# Patient Record
Sex: Male | Born: 1955 | Race: White | Hispanic: No | Marital: Single | Smoking: Current every day smoker
Health system: Southern US, Community
[De-identification: ages and names within clinical notes are randomized; demographics above are authoritative.]

## PROBLEM LIST (undated history)

## (undated) DIAGNOSIS — T7840XA Allergy, unspecified, initial encounter: Secondary | ICD-10-CM

## (undated) DIAGNOSIS — I2510 Atherosclerotic heart disease of native coronary artery without angina pectoris: Secondary | ICD-10-CM

## (undated) DIAGNOSIS — I10 Essential (primary) hypertension: Secondary | ICD-10-CM

## (undated) HISTORY — PX: HERNIA REPAIR: SHX51

## (undated) HISTORY — PX: LEG SURGERY: SHX1003

---

## 2005-10-11 ENCOUNTER — Ambulatory Visit: Payer: Self-pay | Admitting: Cardiology

## 2006-03-20 ENCOUNTER — Ambulatory Visit: Payer: Self-pay | Admitting: Cardiology

## 2010-09-25 ENCOUNTER — Emergency Department (HOSPITAL_COMMUNITY): Payer: Self-pay

## 2010-09-25 ENCOUNTER — Emergency Department (HOSPITAL_COMMUNITY)
Admission: EM | Admit: 2010-09-25 | Discharge: 2010-09-26 | Disposition: A | Discharge status: Home / Self Care | Payer: Self-pay | Attending: Emergency Medicine | Admitting: Emergency Medicine

## 2010-09-25 ENCOUNTER — Encounter: Payer: Self-pay | Admitting: *Deleted

## 2010-09-25 ENCOUNTER — Other Ambulatory Visit: Payer: Self-pay

## 2010-09-25 DIAGNOSIS — R079 Chest pain, unspecified: Secondary | ICD-10-CM | POA: Insufficient documentation

## 2010-09-25 DIAGNOSIS — R55 Syncope and collapse: Secondary | ICD-10-CM | POA: Insufficient documentation

## 2010-09-25 DIAGNOSIS — J189 Pneumonia, unspecified organism: Secondary | ICD-10-CM | POA: Insufficient documentation

## 2010-09-25 DIAGNOSIS — I2510 Atherosclerotic heart disease of native coronary artery without angina pectoris: Secondary | ICD-10-CM | POA: Insufficient documentation

## 2010-09-25 DIAGNOSIS — R059 Cough, unspecified: Secondary | ICD-10-CM | POA: Insufficient documentation

## 2010-09-25 DIAGNOSIS — I10 Essential (primary) hypertension: Secondary | ICD-10-CM | POA: Insufficient documentation

## 2010-09-25 DIAGNOSIS — R05 Cough: Secondary | ICD-10-CM | POA: Insufficient documentation

## 2010-09-25 DIAGNOSIS — Z79899 Other long term (current) drug therapy: Secondary | ICD-10-CM | POA: Insufficient documentation

## 2010-09-25 DIAGNOSIS — F17200 Nicotine dependence, unspecified, uncomplicated: Secondary | ICD-10-CM | POA: Insufficient documentation

## 2010-09-25 HISTORY — DX: Atherosclerotic heart disease of native coronary artery without angina pectoris: I25.10

## 2010-09-25 HISTORY — DX: Essential (primary) hypertension: I10

## 2010-09-25 NOTE — ED Notes (Signed)
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

## 2010-09-26 LAB — CBC
HCT: 35.1 % — ABNORMAL LOW (ref 39.0–52.0)
Hemoglobin: 12.1 g/dL — ABNORMAL LOW (ref 13.0–17.0)
WBC: 7.2 10*3/uL (ref 4.0–10.5)

## 2010-09-26 LAB — BASIC METABOLIC PANEL
BUN: 5 mg/dL — ABNORMAL LOW (ref 6–23)
CO2: 26 mEq/L (ref 19–32)
Chloride: 97 mEq/L (ref 96–112)
Glucose, Bld: 92 mg/dL (ref 70–99)
Potassium: 3.6 mEq/L (ref 3.5–5.1)

## 2010-09-26 LAB — CARDIAC PANEL(CRET KIN+CKTOT+MB+TROPI)
CK, MB: 2 ng/mL (ref 0.3–4.0)
Relative Index: INVALID (ref 0.0–2.5)
Troponin I: <0.3 ng/mL (ref <0.30)
Troponin I: <0.3 ng/mL (ref <0.30)

## 2010-09-26 MED ORDER — DEXTROSE 5 % IV SOLN
INTRAVENOUS | Status: AC
  Administered 2010-09-26: 02:00:00
  Filled 2010-09-26: qty 1

## 2010-09-26 MED ORDER — AZITHROMYCIN 250 MG PO TABS: 250.0000 mg | ORAL_TABLET | Freq: Every day | ORAL | Status: AC

## 2010-09-26 MED ORDER — DEXTROSE 5 % IV SOLN: 1.0000 g | Freq: Once | INTRAVENOUS | Status: DC

## 2010-09-26 MED ORDER — AZITHROMYCIN 250 MG PO TABS
500.0000 mg | ORAL_TABLET | Freq: Once | ORAL | Status: AC
  Administered 2010-09-26: 500 mg via ORAL
  Filled 2010-09-26: qty 2

## 2010-09-26 NOTE — ED Provider Notes (Signed)
History     Chief Complaint  Patient presents with  . Chest Pain   Patient is a 55 y.o. male presenting with chest pain. The history is provided by the patient.  Chest Pain Episode onset: today. Episode Length: pt is unable to tell me duration. Chest pain occurs intermittently. The chest pain is resolved. The severity of the pain is mild. The quality of the pain is described as aching. The pain does not radiate. Primary symptoms include shortness of breath and cough.    Pt admits to heavy ETOH use today.  He reports he felt dehydrated, he became dizzy and had brief syncopal episode, but denies injury.  He also reports some CP earlier but was brief and has resolved.    Past Medical History  Diagnosis Date  . Hypertension   . Coronary artery disease     History reviewed. No pertinent past surgical history.  No family history on file.  History  Substance Use Topics  . Smoking status: Current Everyday Smoker    Types: Cigarettes  . Smokeless tobacco: Not on file  . Alcohol Use: Yes      Review of Systems  Respiratory: Positive for cough and shortness of breath.   Cardiovascular: Positive for chest pain.  All other systems reviewed and are negative.    Physical Exam  BP 123/82  Pulse 79  Temp(Src) 97.6 F (36.4 C) (Oral)  Resp 77  SpO2 96%  Physical Exam  CONSTITUTIONAL: Well developed/well nourished HEAD AND FACE: Normocephalic/atraumatic, no signs of trauma EYES: EOMI/PERRL ENMT: Mucous membranes dry NECK: supple no meningeal signs SPINE:cspine nontender CV: S1/S2 noted, no murmurs/rubs/gallops noted LUNGS: Lungs are clear to auscultation bilaterally, no apparent distress ABDOMEN: soft, nontender, no rebound or guarding  NEURO: Pt is awake/alert, moves all extremitiesx4.  He was sleeping on my arrival but easily arousable EXTREMITIES: pulses normal, full ROM, no edema SKIN: warm, color normal PSYCH: no abnormalities of mood noted  ED Course   Procedures  MDM Nursing notes reviewed and considered in documentation Previous records reviewed and considered xrays reviewed and considered  ED ECG REPORT   Date: 09/26/2010   Rate: 78  Rhythm: normal sinus rhythm  QRS Axis: normal  Intervals: normal  ST/T Wave abnormalities: normal  Conduction Disutrbances:none  Narrative Interpretation:   Old EKG Reviewed: none available   Pt is in no distress, sleeping, admits to heavy ETOH use and he reports CP was only minimal.  He has had previous h/o syncope per records and it was felt to be ETOH related at that time   Records from 2008 showed he has had workup for syncope previously.  Usually with etoh intox per those records, he had abnormal cardiolite but negative markers.  There was no indication for cath at that time  Pt resting comfortably, vitals stable 2:04 AM BP 104/60  Pulse 75  Temp(Src) 97.6 F (36.4 C) (Oral)  Resp 28  SpO2 97% RR 16 on my exam  Pt still sleeping Family arrived, reports he had been drinking etoh and took one xanax.  He had brief syncopal event and then woke up.  He did report some chest tightness to family.  He was otherwise at his baseline  Pt never had resp rate above 20 on my exam No hypoxia Ambulatory without difficulty Will start abx for pneumonia, advised close followup for repeat CXR to ensure pneumonia resolved Pt agreeable Suspicion for significant dysrhythmia/PE is low given history.  Suspect this could have been med/etoh  related.  Joya Gaskins, MD 09/26/10 [phone]

## 2010-10-22 ENCOUNTER — Emergency Department (HOSPITAL_COMMUNITY): Payer: Self-pay

## 2010-10-22 ENCOUNTER — Emergency Department (HOSPITAL_COMMUNITY)
Admission: EM | Admit: 2010-10-22 | Discharge: 2010-10-23 | Disposition: A | Discharge status: Home / Self Care | Payer: Self-pay | Attending: Emergency Medicine | Admitting: Emergency Medicine

## 2010-10-22 ENCOUNTER — Encounter (HOSPITAL_COMMUNITY): Payer: Self-pay | Admitting: *Deleted

## 2010-10-22 DIAGNOSIS — R55 Syncope and collapse: Secondary | ICD-10-CM | POA: Insufficient documentation

## 2010-10-22 DIAGNOSIS — F17200 Nicotine dependence, unspecified, uncomplicated: Secondary | ICD-10-CM | POA: Insufficient documentation

## 2010-10-22 DIAGNOSIS — I2510 Atherosclerotic heart disease of native coronary artery without angina pectoris: Secondary | ICD-10-CM | POA: Insufficient documentation

## 2010-10-22 DIAGNOSIS — R079 Chest pain, unspecified: Secondary | ICD-10-CM

## 2010-10-22 DIAGNOSIS — F10929 Alcohol use, unspecified with intoxication, unspecified: Secondary | ICD-10-CM

## 2010-10-22 DIAGNOSIS — R072 Precordial pain: Secondary | ICD-10-CM | POA: Insufficient documentation

## 2010-10-22 DIAGNOSIS — I10 Essential (primary) hypertension: Secondary | ICD-10-CM | POA: Insufficient documentation

## 2010-10-22 DIAGNOSIS — F1010 Alcohol abuse, uncomplicated: Secondary | ICD-10-CM | POA: Insufficient documentation

## 2010-10-22 LAB — BASIC METABOLIC PANEL
Calcium: 8.9 mg/dL (ref 8.4–10.5)
Creatinine, Ser: 0.54 mg/dL (ref 0.50–1.35)
GFR calc non Af Amer: >60 mL/min (ref >60)
Glucose, Bld: 92 mg/dL (ref 70–99)
Sodium: 136 mEq/L (ref 135–145)

## 2010-10-22 LAB — CBC
MCH: 33.8 pg (ref 26.0–34.0)
MCHC: 34.4 g/dL (ref 30.0–36.0)
Platelets: 177 10*3/uL (ref 150–400)

## 2010-10-22 LAB — CARDIAC PANEL(CRET KIN+CKTOT+MB+TROPI)
CK, MB: 2.4 ng/mL (ref 0.3–4.0)
Total CK: 63 U/L (ref 7–232)

## 2010-10-22 NOTE — ED Provider Notes (Signed)
History     CSN: [id number] Arrival date & time: 10/22/2010  8:40 PM  Chief Complaint  Patient presents with  . Chest Pain   HPI Comments: Pt states he has been moving furniture and boxes all day today.   He was sitting at his kitchen table ~ 1630 and started having L sternal border "sharp" CP.  " i stood up and the next thing i knew i was laying on the floor".  Duration of syncope unkown.  Reportedly witnessed by his daughter but she is not here to speak with me.  Pt reports having #3 40 oz. Beers and then several 12 oz beers today.  Patient is a 55 y.o. male presenting with chest pain. The history is provided by the patient. No language interpreter was used.  Chest Pain The chest pain began 6 - 12 hours ago. The pain is associated with lifting and exertion. At its most intense, the pain is at 7/10. The pain is currently at 0/10. The quality of the pain is described as sharp. The pain does not radiate. Exacerbated by: movement. Primary symptoms include syncope. Pertinent negatives for primary symptoms include no shortness of breath, no palpitations, no nausea and no vomiting.  There was no nausea. The syncopal episode did not occur with palpitations or shortness of breath.     Past Medical History  Diagnosis Date  . Hypertension   . Coronary artery disease     History reviewed. No pertinent past surgical history.  History reviewed. No pertinent family history.  History  Substance Use Topics  . Smoking status: Current Everyday Smoker    Types: Cigarettes  . Smokeless tobacco: Not on file  . Alcohol Use: Yes     pt reports 2 40oz drinks today      Review of Systems  Respiratory: Negative for shortness of breath.        "sharp" CP at LSB.   No radiation.    Cardiovascular: Positive for chest pain and syncope. Negative for palpitations and leg swelling.  Gastrointestinal: Negative for nausea and vomiting.  Skin: Negative for pallor.  All other systems reviewed and are  negative.    Physical Exam  BP 96/67  Pulse 66  Temp(Src) 97.4 F (36.3 C) (Oral)  Resp 18  Ht 5\' 10"  (1.778 m)  Wt 140 lb (63.504 kg)  BMI 20.09 kg/m2  SpO2 95%  Physical Exam  Nursing note and vitals reviewed. Constitutional: He is oriented to person, place, and time. Vital signs are normal. He appears well-developed and well-nourished. No distress.  HENT:  Head: Normocephalic and atraumatic.  Right Ear: External ear normal.  Left Ear: External ear normal.  Nose: Nose normal.  Mouth/Throat: No oropharyngeal exudate.  Eyes: Conjunctivae and EOM are normal. Pupils are equal, round, and reactive to light. Right eye exhibits no discharge. Left eye exhibits no discharge. No scleral icterus.  Neck: Normal range of motion. Neck supple. No JVD present. No tracheal deviation present. No thyromegaly present.  Cardiovascular: Normal rate, regular rhythm, normal heart sounds, intact distal pulses and normal pulses.  Exam reveals no gallop and no friction rub.   No murmur heard. Pulmonary/Chest: Effort normal and breath sounds normal. No stridor. No respiratory distress. He has no wheezes. He has no rales. He exhibits no tenderness.  Abdominal: Soft. Normal appearance and bowel sounds are normal. He exhibits no distension and no mass. There is no tenderness. There is no rebound and no guarding.  Musculoskeletal: Normal range of motion.  He exhibits no edema and no tenderness.  Lymphadenopathy:    He has no cervical adenopathy.  Neurological: He is alert and oriented to person, place, and time. He has normal reflexes. No cranial nerve deficit. Coordination normal. GCS eye subscore is 4. GCS verbal subscore is 5. GCS motor subscore is 6.  Reflex Scores:      Tricep reflexes are 2+ on the right side and 2+ on the left side.      Bicep reflexes are 2+ on the right side and 2+ on the left side.      Brachioradialis reflexes are 2+ on the right side and 2+ on the left side.      Patellar reflexes  are 2+ on the right side and 2+ on the left side.      Achilles reflexes are 2+ on the right side and 2+ on the left side. Skin: Skin is warm and dry. No rash noted. He is not diaphoretic.  Psychiatric: He has a normal mood and affect. His speech is normal and behavior is normal. Judgment and thought content normal. Cognition and memory are normal.    ED Course  Procedures  MDM       Worthy Rancher, PA 10/22/10 2342  Worthy Rancher, PA 12/22/10 1409

## 2010-10-22 NOTE — ED Notes (Addendum)
Pt reports he was moving and lifting boxes when he began experiencing chest pain.  Per EMS, pt has drunk 2 40's and taken 3 Xanax today.  Verified by pt.  Pt presently denies any pain.  At this time, pt somewhat drowsy and speech slurred.  Pt placed on monitor.  No SOB or distress noted.

## 2010-10-22 NOTE — ED Notes (Signed)
Pt reports he was moving, and became dizzy and "everything went black."  States chest pain started sometime this evening, unsure what time.

## 2010-10-22 NOTE — ED Notes (Signed)
Received call from pt's daughter, Shaft Corigliano, who stated she will be driving pt home if he is to be discharged.  Number provided [phone].

## 2010-10-22 NOTE — ED Notes (Addendum)
Pt resting quietly at this time. No distress noted.

## 2010-10-23 ENCOUNTER — Other Ambulatory Visit: Payer: Self-pay

## 2010-10-23 NOTE — ED Notes (Signed)
Attempted to call pt's daughter, as requested, to come pick pt up post discharge.  Left voice mail.

## 2010-10-23 NOTE — ED Notes (Signed)
Returned call to pt's daughter.  She states she is on her way to pick pt up at this time.

## 2010-11-13 ENCOUNTER — Emergency Department (HOSPITAL_COMMUNITY): Payer: Self-pay

## 2010-11-13 ENCOUNTER — Emergency Department (HOSPITAL_COMMUNITY)
Admission: EM | Admit: 2010-11-13 | Discharge: 2010-11-13 | Disposition: A | Discharge status: Home / Self Care | Payer: Self-pay | Attending: Emergency Medicine | Admitting: Emergency Medicine

## 2010-11-13 ENCOUNTER — Encounter (HOSPITAL_COMMUNITY): Payer: Self-pay | Admitting: Emergency Medicine

## 2010-11-13 DIAGNOSIS — N390 Urinary tract infection, site not specified: Secondary | ICD-10-CM | POA: Insufficient documentation

## 2010-11-13 DIAGNOSIS — I10 Essential (primary) hypertension: Secondary | ICD-10-CM | POA: Insufficient documentation

## 2010-11-13 DIAGNOSIS — Z7982 Long term (current) use of aspirin: Secondary | ICD-10-CM | POA: Insufficient documentation

## 2010-11-13 DIAGNOSIS — Z888 Allergy status to other drugs, medicaments and biological substances status: Secondary | ICD-10-CM | POA: Insufficient documentation

## 2010-11-13 DIAGNOSIS — I2510 Atherosclerotic heart disease of native coronary artery without angina pectoris: Secondary | ICD-10-CM | POA: Insufficient documentation

## 2010-11-13 DIAGNOSIS — F17200 Nicotine dependence, unspecified, uncomplicated: Secondary | ICD-10-CM | POA: Insufficient documentation

## 2010-11-13 LAB — CBC
MCH: 33.9 pg (ref 26.0–34.0)
MCV: 98.8 fL (ref 78.0–100.0)
Platelets: 178 10*3/uL (ref 150–400)
RDW: 14.8 % (ref 11.5–15.5)

## 2010-11-13 LAB — COMPREHENSIVE METABOLIC PANEL
ALT: 27 U/L (ref 0–53)
AST: 62 U/L — ABNORMAL HIGH (ref 0–37)
Albumin: 3.3 g/dL — ABNORMAL LOW (ref 3.5–5.2)
Calcium: 8.6 mg/dL (ref 8.4–10.5)
GFR calc Af Amer: >60 mL/min (ref >60)
Sodium: 135 mEq/L (ref 135–145)
Total Protein: 7 g/dL (ref 6.0–8.3)

## 2010-11-13 LAB — URINALYSIS, ROUTINE W REFLEX MICROSCOPIC
Bilirubin Urine: NEGATIVE
Nitrite: NEGATIVE
Protein, ur: NEGATIVE mg/dL
Urobilinogen, UA: 0.2 mg/dL (ref 0.0–1.0)

## 2010-11-13 LAB — DIFFERENTIAL
Basophils Absolute: 0.1 10*3/uL (ref 0.0–0.1)
Basophils Relative: 1 % (ref 0–1)
Eosinophils Absolute: 0.1 10*3/uL (ref 0.0–0.7)
Eosinophils Relative: 1 % (ref 0–5)

## 2010-11-13 LAB — URINE MICROSCOPIC-ADD ON

## 2010-11-13 MED ORDER — CIPROFLOXACIN HCL 500 MG PO TABS: 500.0000 mg | ORAL_TABLET | Freq: Two times a day (BID) | ORAL | Status: AC

## 2010-11-13 MED ORDER — ONDANSETRON HCL 4 MG PO TABS: 4.0000 mg | ORAL_TABLET | Freq: Four times a day (QID) | ORAL | Status: AC

## 2010-11-13 MED ORDER — DEXTROSE 5 % IV SOLN
1.0000 g | Freq: Once | INTRAVENOUS | Status: AC
  Administered 2010-11-13: 1 g via INTRAVENOUS
  Filled 2010-11-13: qty 1

## 2010-11-13 MED ORDER — KETOROLAC TROMETHAMINE 30 MG/ML IJ SOLN
30.0000 mg | Freq: Once | INTRAMUSCULAR | Status: AC
  Administered 2010-11-13: 30 mg via INTRAVENOUS
  Filled 2010-11-13: qty 1

## 2010-11-13 MED ORDER — SODIUM CHLORIDE 0.9 % IV SOLN
Freq: Once | INTRAVENOUS | Status: AC
  Administered 2010-11-13: 14:00:00 via INTRAVENOUS

## 2010-11-13 MED ORDER — IOHEXOL 300 MG/ML  SOLN
100.0000 mL | Freq: Once | INTRAMUSCULAR | Status: AC | PRN
  Administered 2010-11-13: 100 mL via INTRAVENOUS

## 2010-11-13 MED ORDER — HYDROCODONE-ACETAMINOPHEN 5-325 MG PO TABS: 1.0000 | ORAL_TABLET | ORAL | Status: AC | PRN

## 2010-11-13 MED ORDER — HYDROMORPHONE HCL 1 MG/ML IJ SOLN
1.0000 mg | Freq: Once | INTRAMUSCULAR | Status: AC
  Administered 2010-11-13: 1 mg via INTRAVENOUS
  Filled 2010-11-13: qty 1

## 2010-11-13 MED ORDER — PANTOPRAZOLE SODIUM 40 MG IV SOLR
40.0000 mg | Freq: Once | INTRAVENOUS | Status: AC
  Administered 2010-11-13: 40 mg via INTRAVENOUS
  Filled 2010-11-13: qty 40

## 2010-11-13 MED ORDER — ONDANSETRON HCL 4 MG/2ML IJ SOLN
4.0000 mg | Freq: Once | INTRAMUSCULAR | Status: AC
  Administered 2010-11-13: 4 mg via INTRAVENOUS
  Filled 2010-11-13: qty 2

## 2010-11-13 NOTE — ED Notes (Signed)
Pt c/o rt groin pain that is sharp in nature. Pain started this am when pt went to use bathroom and had difficulty voiding.pt states he has" urge to go and is hard to pee when urge hits me"  Pt states " there was blood in my urine at the end" pt states pain is intermittent. Pt states " I consumed a lot of alcohol over the weekend for my birthday" pt had a mixed drink this am. Pt recently had unprotected sex within the last month and recently told his sexual partner has Hepatitis C.

## 2010-11-13 NOTE — ED Notes (Signed)
Pt states he went to urinate this am and he states he had a difficult time going and when he finished he was bleeding.

## 2010-11-13 NOTE — ED Provider Notes (Signed)
Scribed for Justin Hutching, MD, the patient was seen in room APFT24/APFT24 . This chart was scribed by Ellie Lunch. This patient's care was started at 10:07 AM.   CSN: [id number] Arrival date & time: 11/13/2010 10:01 AM  Chief Complaint  Patient presents with  . Hematuria   HPI Duke Justin is a 55 y.o. male who presents to the Emergency Department complaining of hematuria. Pt reports at work this morning he went to the restroom and had a small amount of blood at the end of urinating. Pt reports no other episodes of blood in his urine. Pt also reports he has been able to taste blood since this morning. He denies coughing or spotting up blood. Pt reports no PCP. Pt has a history of HTN and is not taking any medications for treatment.  Past Medical History  Diagnosis Date  . Hypertension   . Coronary artery disease     Past Surgical History  Procedure Date  . Leg surgery   . Hernia repair    MEDICATIONS:  Previous Medications   ASPIRIN 81 MG TABLET    Take 81 mg by mouth daily.     NITROGLYCERIN (NITROSTAT) 0.4 MG SL TABLET    Place 0.4 mg under the tongue every 5 (five) minutes as needed.     UNKNOWN TO PATIENT    Take 1 tablet by mouth daily. BLOOD PRESSURE MEDICATION: NAME UNKNOWN     ALLERGIES:  Allergies as of 11/13/2010 - Review Complete 11/13/2010  Allergen Reaction Noted  . Bee venom  11/13/2010  . Novocain Nausea And Vomiting 09/26/2010     History reviewed. No pertinent family history.  History  Substance Use Topics  . Smoking status: Current Everyday Smoker -- 2.0 packs/day    Types: Cigarettes  . Smokeless tobacco: Not on file  . Alcohol Use: Yes     pt reports 2 40oz drinks today "heavy"    Review of Systems  Physical Exam  BP 155/95  Pulse 75  Temp(Src) 98.4 F (36.9 C) (Oral)  Resp 20  Ht 5\' 9"  (1.753 m)  Wt 134 lb (60.782 kg)  BMI 19.79 kg/m2  SpO2 100%  Physical Exam  Nursing note and vitals reviewed. Constitutional: He is oriented to  person, place, and time. He appears well-developed.       PT appears thin and older than stated age. Pt's face appears very wrinkled.    HENT:  Head: Normocephalic and atraumatic.  Eyes: Conjunctivae and EOM are normal. Pupils are equal, round, and reactive to light.  Neck: Neck supple.  Cardiovascular: Normal rate and regular rhythm.   Pulmonary/Chest: Effort normal and breath sounds normal.  Abdominal: There is tenderness.       Slight Tenderness RLQ.   Genitourinary: Rectum normal, prostate normal and penis normal.       Urine appears clear.   Musculoskeletal: Normal range of motion.  Neurological: He is alert and oriented to person, place, and time.  Skin: Skin is warm and dry.  Psychiatric: He has a normal mood and affect.   Procedures  OTHER DATA REVIEWED: Nursing notes, vital signs, and past medical records reviewed.   DIAGNOSTIC STUDIES: Oxygen Saturation is 100% on room air, normal by my interpretation.    LABS / RADIOLOGY:  Results for orders placed during the hospital encounter of 11/13/10  URINALYSIS, ROUTINE W REFLEX MICROSCOPIC      Component Value Range   Color, Urine YELLOW  YELLOW    Appearance HAZY (*)  CLEAR    Specific Gravity, Urine <1.005 (*) 1.005 - 1.030    pH 6.0  5.0 - 8.0    Glucose, UA NEGATIVE  NEGATIVE (mg/dL)   Hgb urine dipstick LARGE (*) NEGATIVE    Bilirubin Urine NEGATIVE  NEGATIVE    Ketones, ur NEGATIVE  NEGATIVE (mg/dL)   Protein, ur NEGATIVE  NEGATIVE (mg/dL)   Urobilinogen, UA 0.2  0.0 - 1.0 (mg/dL)   Nitrite NEGATIVE  NEGATIVE    Leukocytes, UA LARGE (*) NEGATIVE   CBC      Component Value Range   WBC 7.4  4.0 - 10.5 (K/uL)   RBC 4.13 (*) 4.22 - 5.81 (MIL/uL)   Hemoglobin 14.0  13.0 - 17.0 (g/dL)   HCT 16.1  09.6 - 04.5 (%)   MCV 98.8  78.0 - 100.0 (fL)   MCH 33.9  26.0 - 34.0 (pg)   MCHC 34.3  30.0 - 36.0 (g/dL)   RDW 40.9  81.1 - 91.4 (%)   Platelets 178  150 - 400 (K/uL)  DIFFERENTIAL      Component Value Range    Neutrophils Relative 69  43 - 77 (%)   Neutro Abs 5.1  1.7 - 7.7 (K/uL)   Lymphocytes Relative 22  12 - 46 (%)   Lymphs Abs 1.6  0.7 - 4.0 (K/uL)   Monocytes Relative 7  3 - 12 (%)   Monocytes Absolute 0.5  0.1 - 1.0 (K/uL)   Eosinophils Relative 1  0 - 5 (%)   Eosinophils Absolute 0.1  0.0 - 0.7 (K/uL)   Basophils Relative 1  0 - 1 (%)   Basophils Absolute 0.1  0.0 - 0.1 (K/uL)  COMPREHENSIVE METABOLIC PANEL      Component Value Range   Sodium 135  135 - 145 (mEq/L)   Potassium 3.6  3.5 - 5.1 (mEq/L)   Chloride 100  96 - 112 (mEq/L)   CO2 27  19 - 32 (mEq/L)   Glucose, Bld 91  70 - 99 (mg/dL)   BUN 4 (*) 6 - 23 (mg/dL)   Creatinine, Ser 7.82  0.50 - 1.35 (mg/dL)   Calcium 8.6  8.4 - 95.6 (mg/dL)   Total Protein 7.0  6.0 - 8.3 (g/dL)   Albumin 3.3 (*) 3.5 - 5.2 (g/dL)   AST 62 (*) 0 - 37 (U/L)   ALT 27  0 - 53 (U/L)   Alkaline Phosphatase 57  39 - 117 (U/L)   Total Bilirubin 0.4  0.3 - 1.2 (mg/dL)   GFR calc non Af Amer >60  >60 (mL/min)   GFR calc Af Amer >60  >60 (mL/min)  URINE MICROSCOPIC-ADD ON      Component Value Range   Squamous Epithelial / LPF RARE  RARE    WBC, UA 7-10  <3 (WBC/hpf)   RBC / HPF 3-6  <3 (RBC/hpf)   Bacteria, UA FEW (*) RARE    Dg Chest 2 View  11/13/2010  *RADIOLOGY REPORT*  Clinical Data: Hemoptysis.  CHEST - 2 VIEW  Comparison: 08/13 and 09/25/2010  Findings: The heart size and vascularity are normal and the lungs are clear.  No effusions.  Old trauma to the distal right clavicle  IMPRESSION: No acute abnormalities.  Original Report Authenticated By: Gwynn Burly, M.D.   Ct Abdomen Pelvis W Contrast  11/13/2010  *RADIOLOGY REPORT*  Clinical Data: Hematuria  CT ABDOMEN AND PELVIS WITH CONTRAST  Technique:  Multidetector CT imaging of the abdomen and pelvis was  performed following the standard protocol during bolus administration of intravenous contrast.  Contrast: 100 ml Omnipaque-300  Comparison: None.  Findings: Tiny hypodensities in the kidneys  are too small to characterize.  Kidneys are otherwise unremarkable.  Specifically, there is no evidence of hydronephrosis, renal mass, or nephrolithiasis.  Liver, gallbladder, spleen, pancreas are within normal limits. Prominence of the adrenal glands in a hyperplasia pattern.  Focal wedge-shaped enhancement in the medial segment of the left lobe of the liver on image 19 of series 2 is a vascular phenomenon.  Small gastrohepatic ligament nodes are present.  1.4 cm calcified soft tissue density in the left lower quadrant, lateral to the descending colon 45 has a benign appearance.  Grade 1 L5-S1 spondylolisthesis. L4-5 central disc protrusion with bilateral lateral recess narrowing.  The bladder is distended.  Mild thickening of the bladder wall is diffuse.  Diverticulosis of the sigmoid colon without definitive evidence of inflammatory disease.  Atherosclerotic changes of the aorta and iliac vasculature are noted.  Unremarkable prostate gland.  IMPRESSION: Kidneys are within normal limits.  Bladder is distended with bladder wall thickening.  Correlate with urinalysis as for the need for cystoscopy.  L5-S1 spondylolisthesis.  Original Report Authenticated By: Donavan Burnet, M.D.   Dg Chest Portable 1 View  10/23/2010  *RADIOLOGY REPORT*  Clinical Data: Chest pain.  Lethargy.  PORTABLE CHEST - 1 VIEW  Comparison: 09/25/2010  Findings: The patient is rotated to the right on today's exam, resulting in reduced diagnostic sensitivity and specificity.   The left rib deformities compatible with healed fractures noted.  The reticulonodular interstitial accentuation shown on the prior exam has resolved.  The lungs appear clear.  Cardiac and mediastinal contours appear unremarkable.  There is evidence of old displaced right clavicular fracture.  IMPRESSION:  1.  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 2.  No acute findings.  Original Report Authenticated By: Dellia Cloud, M.D.    ED  COURSE / COORDINATION OF CARE: 10:55  AM EDP discussed diagnostic possibilities with patient including kidney stones and ordered the following:  . CT Abdomen Pelvis W Contrast  . DG Chest 2 View  . Urinalysis with microscopic  . CBC  . Differential  . Comprehensive metabolic panel  . Urine microscopic-add on   MDM: Complains of hematuria CT abdomen and pelvis shows no kidney stones. 7-10 white cells in urine. Will culture urine start antibiotic IMPRESSION: Diagnoses that have been ruled out:  Diagnoses that are still under consideration:  Final diagnoses:    MEDICATIONS GIVEN IN THE E.D.  Medications  HYDROmorphone (DILAUDID) injection 1 mg (1 mg Intravenous Given 11/13/10 1217)  ketorolac (TORADOL) injection 30 mg (30 mg Intravenous Given 11/13/10 1217)  ondansetron (ZOFRAN) injection 4 mg (4 mg Intravenous Given 11/13/10 1217)   SCRIBE ATTESTATION: I personally performed the services described in this documentation, which was scribed in my presence. The recorded information has been reviewed and considered. No att. providers found        Justin Hutching, MD 11/20/10 1954

## 2010-11-13 NOTE — ED Provider Notes (Addendum)
Medical screening examination/treatment/procedure(s) were conducted as a shared visit with non-physician practitioner(s) and myself.  I personally evaluated the patient during the encounter   Benny Lennert, MD 11/13/10 1311  Benny Lennert, MD 11/20/10 1039

## 2010-11-14 LAB — URINE CULTURE: Culture  Setup Time: 201209041745

## 2010-12-26 NOTE — ED Provider Notes (Signed)
Medical screening examination/treatment/procedure(s) were performed by non-physician practitioner and as supervising physician I was immediately available for consultation/collaboration.  Laray Anger, DO 12/26/10 [phone]

## 2011-09-12 IMAGING — CT CT ABD-PELV W/ CM
2 of 4 series · 16 of 46 positions shown, 18 images · IV contrast (Omnipaque 300)
Comparison: None.

CLINICAL DATA: Hematuria

CT ABDOMEN AND PELVIS WITH CONTRAST
TECHNIQUE: Multidetector CT imaging of the abdomen and pelvis was
performed following the standard protocol during bolus
administration of intravenous contrast.
Contrast: 100 ml Xmnipaque-ESS

[Series 2: abd_pel_with 5.0 b40f · axial · 0.59mm/px · z∈[-408,-23]mm · 13 of 85 slices shown, 15 images]
[im 4/85  soft-tissue]
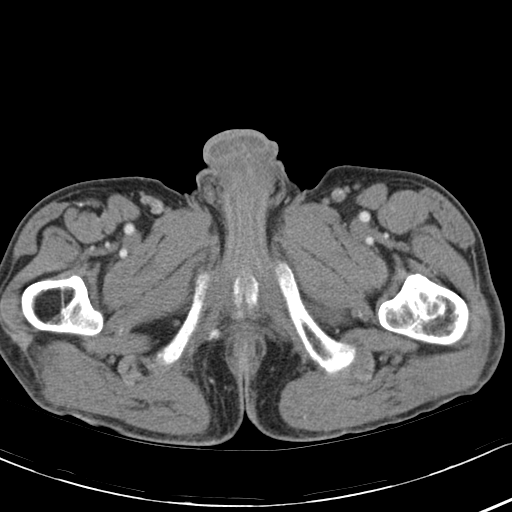
[im 4/85  bone]
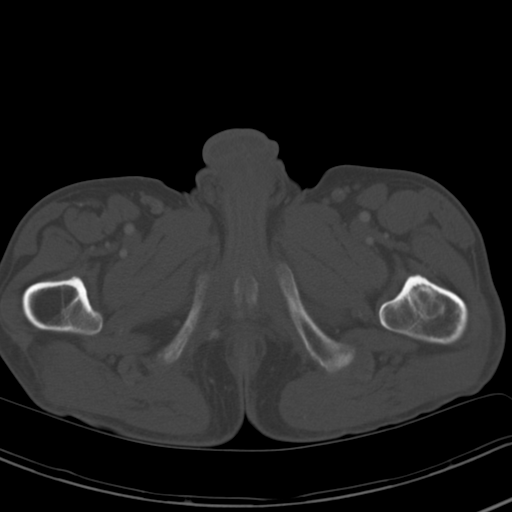
[im 12/85  soft-tissue]
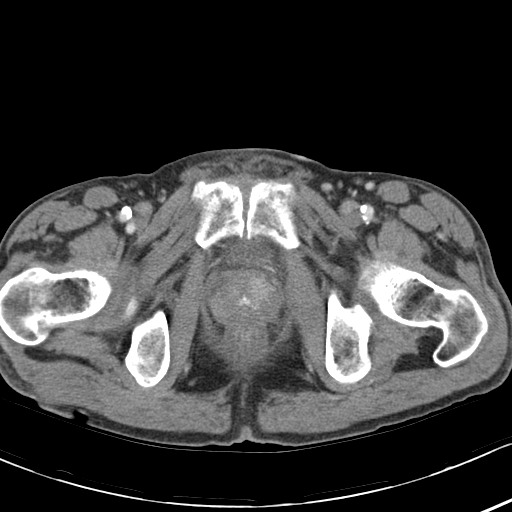
[im 20/85  soft-tissue]
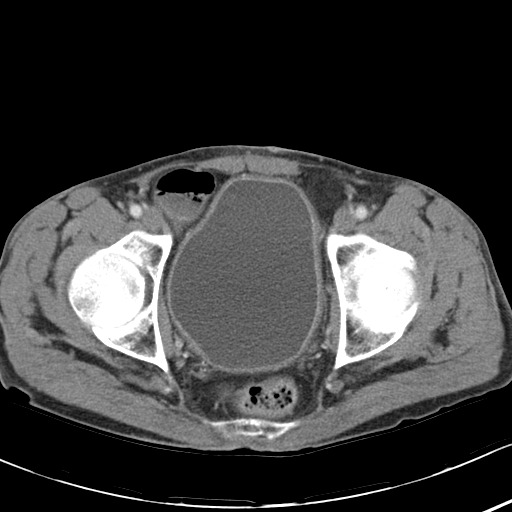
[im 23/85  soft-tissue]
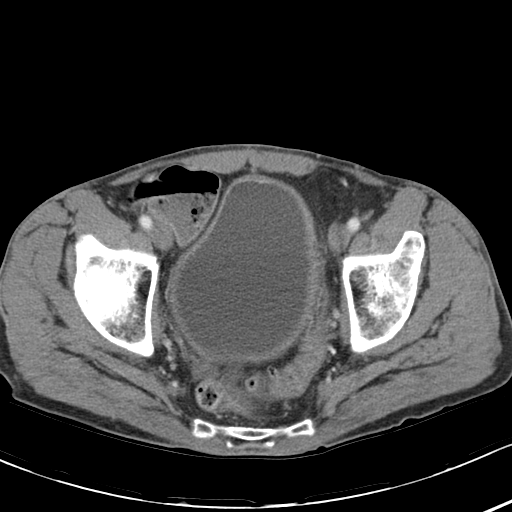
[im 31/85  soft-tissue]
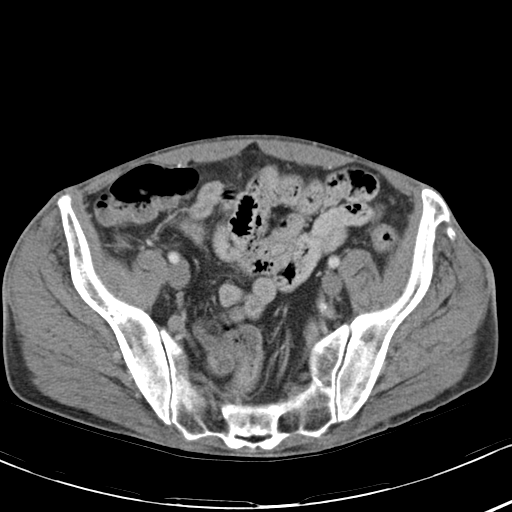
[im 35/85  soft-tissue]
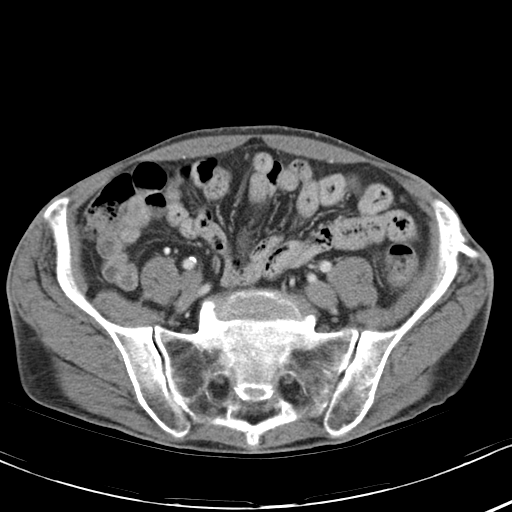
[im 43/85  soft-tissue]
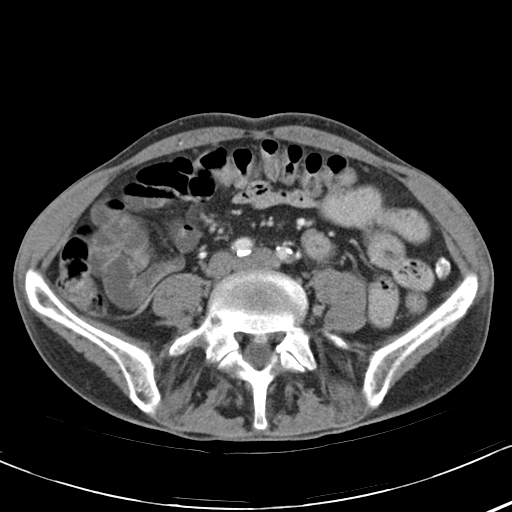
[im 50/85  soft-tissue]
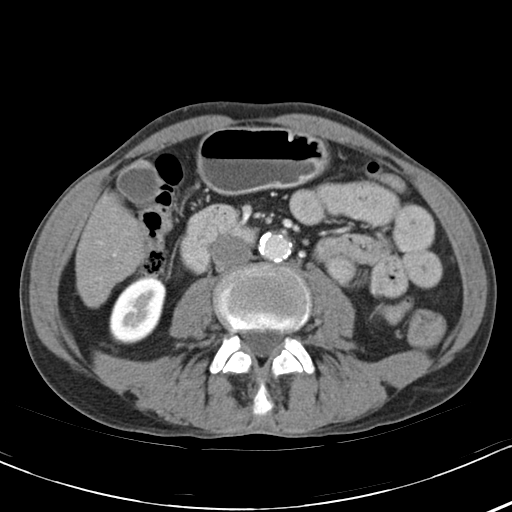
[im 54/85  soft-tissue]
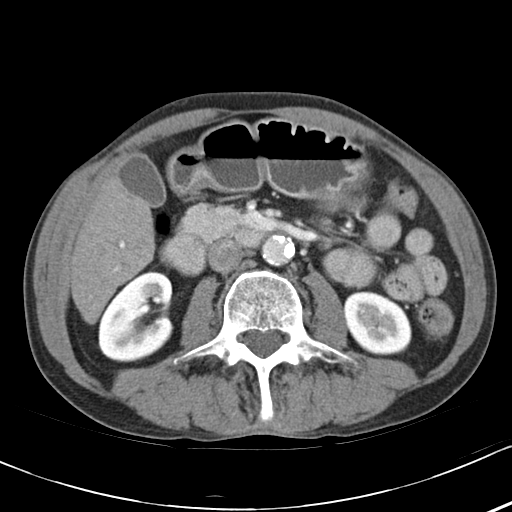
[im 54/85  bone]
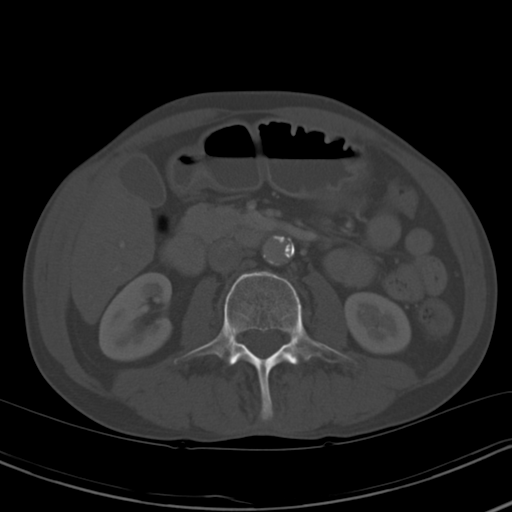
[im 62/85  soft-tissue]
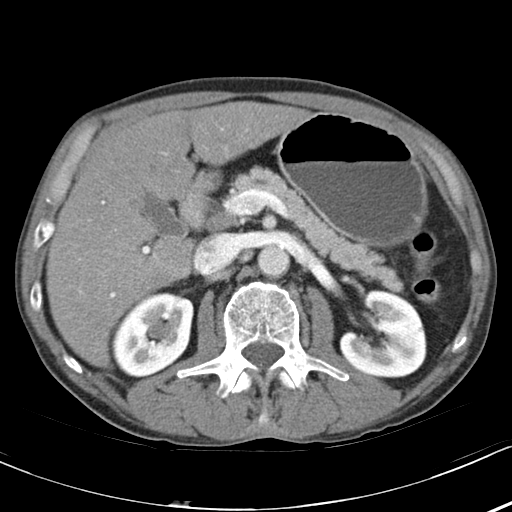
[im 65/85  soft-tissue]
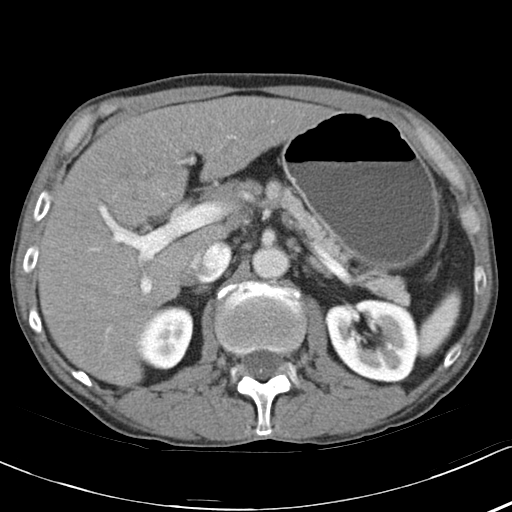
[im 73/85  soft-tissue]
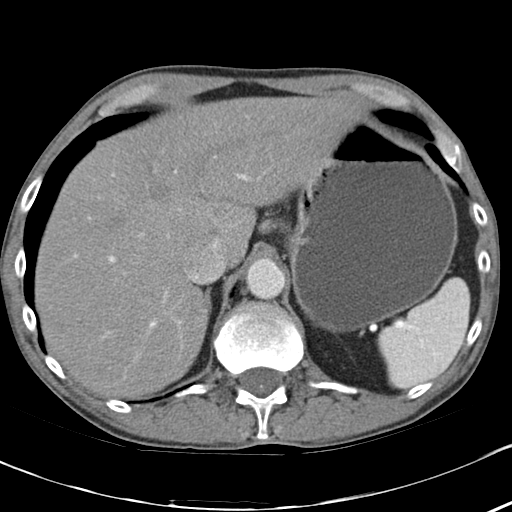
[im 81/85  soft-tissue]
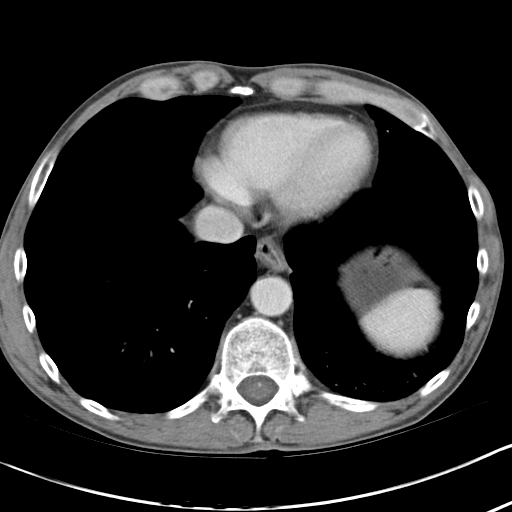

[Series 4: abd_pel_with 3.0 spo cor · coronal · 0.63mm/px · 3 of 69 slices shown]
[im 23/69  soft-tissue]
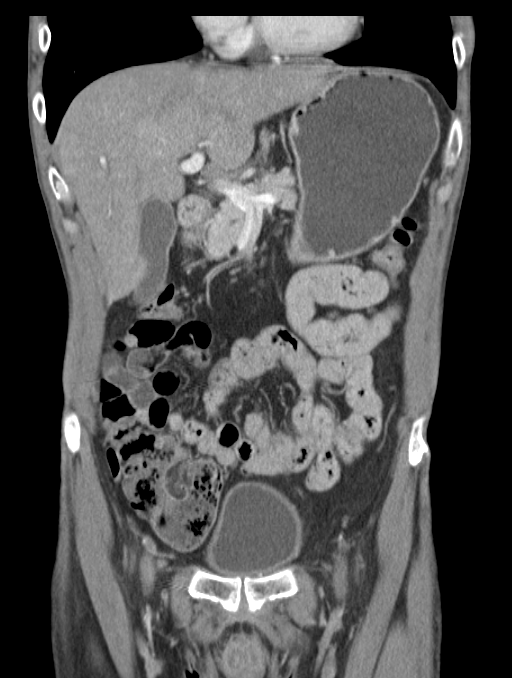
[im 31/69  soft-tissue]
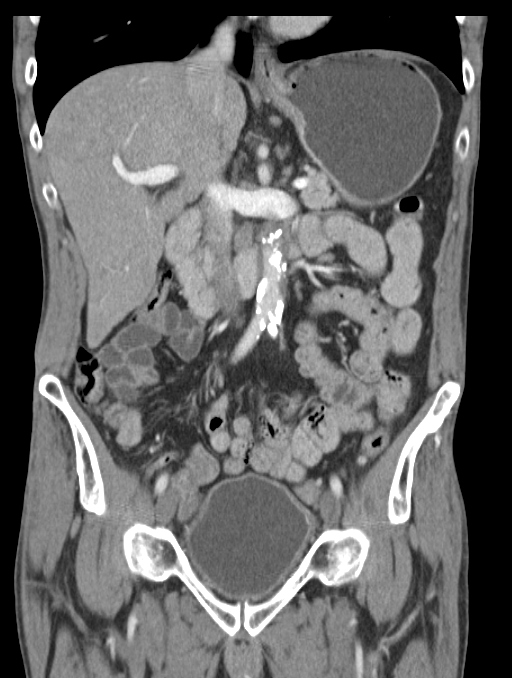
[im 38/69  soft-tissue]
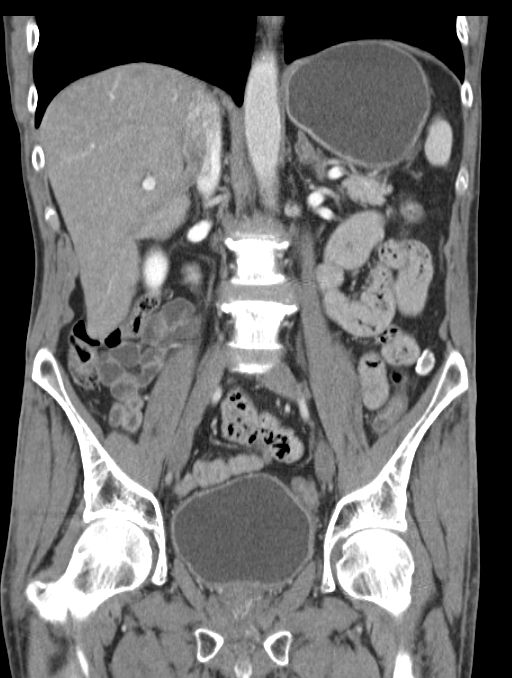

[16 of 46 positions shown; findings below may reference images not displayed]

FINDINGS: Tiny hypodensities in the kidneys are too small to
characterize.  Kidneys are otherwise unremarkable.  Specifically,
there is no evidence of hydronephrosis, renal mass, or
nephrolithiasis.

Liver, gallbladder, spleen, pancreas are within normal limits.
Prominence of the adrenal glands in a hyperplasia pattern.  Focal
wedge-shaped enhancement in the medial segment of the left lobe of
the liver on image 19 of series 2 is a vascular phenomenon.

Small gastrohepatic ligament nodes are present.

1.4 cm calcified soft tissue density in the left lower quadrant,
lateral to the descending colon 45 has a benign appearance.

Grade 1 L5-S1 spondylolisthesis. L4-5 central disc protrusion with
bilateral lateral recess narrowing.

The bladder is distended.  Mild thickening of the bladder wall is
diffuse.

Diverticulosis of the sigmoid colon without definitive evidence of
inflammatory disease.

Atherosclerotic changes of the aorta and iliac vasculature are
noted.  Unremarkable prostate gland.
IMPRESSION: Kidneys are within normal limits.

Bladder is distended with bladder wall thickening.  Correlate with
urinalysis as for the need for cystoscopy.

L5-S1 spondylolisthesis.

## 2012-11-06 ENCOUNTER — Emergency Department (HOSPITAL_COMMUNITY): Payer: Medicaid Other

## 2012-11-06 ENCOUNTER — Encounter (HOSPITAL_COMMUNITY): Payer: Self-pay | Admitting: *Deleted

## 2012-11-06 ENCOUNTER — Telehealth: Payer: Self-pay | Admitting: Internal Medicine

## 2012-11-06 ENCOUNTER — Emergency Department (HOSPITAL_COMMUNITY)
Admission: EM | Admit: 2012-11-06 | Discharge: 2012-11-06 | Disposition: A | Discharge status: Home / Self Care | Payer: Medicaid Other | Attending: Emergency Medicine | Admitting: Emergency Medicine

## 2012-11-06 DIAGNOSIS — Z79899 Other long term (current) drug therapy: Secondary | ICD-10-CM | POA: Insufficient documentation

## 2012-11-06 DIAGNOSIS — I10 Essential (primary) hypertension: Secondary | ICD-10-CM | POA: Insufficient documentation

## 2012-11-06 DIAGNOSIS — I2510 Atherosclerotic heart disease of native coronary artery without angina pectoris: Secondary | ICD-10-CM | POA: Insufficient documentation

## 2012-11-06 DIAGNOSIS — R059 Cough, unspecified: Secondary | ICD-10-CM | POA: Insufficient documentation

## 2012-11-06 DIAGNOSIS — C329 Malignant neoplasm of larynx, unspecified: Secondary | ICD-10-CM

## 2012-11-06 DIAGNOSIS — Z87898 Personal history of other specified conditions: Secondary | ICD-10-CM | POA: Insufficient documentation

## 2012-11-06 DIAGNOSIS — R1310 Dysphagia, unspecified: Secondary | ICD-10-CM | POA: Insufficient documentation

## 2012-11-06 DIAGNOSIS — R634 Abnormal weight loss: Secondary | ICD-10-CM | POA: Insufficient documentation

## 2012-11-06 DIAGNOSIS — C7839 Secondary malignant neoplasm of other respiratory organs: Secondary | ICD-10-CM | POA: Insufficient documentation

## 2012-11-06 DIAGNOSIS — R05 Cough: Secondary | ICD-10-CM | POA: Insufficient documentation

## 2012-11-06 DIAGNOSIS — R61 Generalized hyperhidrosis: Secondary | ICD-10-CM | POA: Insufficient documentation

## 2012-11-06 DIAGNOSIS — Z7982 Long term (current) use of aspirin: Secondary | ICD-10-CM | POA: Insufficient documentation

## 2012-11-06 DIAGNOSIS — F17200 Nicotine dependence, unspecified, uncomplicated: Secondary | ICD-10-CM | POA: Insufficient documentation

## 2012-11-06 LAB — CBC WITH DIFFERENTIAL/PLATELET
Basophils Absolute: 0.1 10*3/uL (ref 0.0–0.1)
Eosinophils Relative: 2 % (ref 0–5)
HCT: 35.4 % — ABNORMAL LOW (ref 39.0–52.0)
Lymphocytes Relative: 28 % (ref 12–46)
MCV: 91.2 fL (ref 78.0–100.0)
Monocytes Absolute: 0.6 10*3/uL (ref 0.1–1.0)
RDW: 16.2 % — ABNORMAL HIGH (ref 11.5–15.5)
WBC: 7 10*3/uL (ref 4.0–10.5)

## 2012-11-06 LAB — COMPREHENSIVE METABOLIC PANEL
BUN: 3 mg/dL — ABNORMAL LOW (ref 6–23)
CO2: 30 mEq/L (ref 19–32)
Calcium: 9.6 mg/dL (ref 8.4–10.5)
Creatinine, Ser: 0.54 mg/dL (ref 0.50–1.35)
GFR calc Af Amer: >90 mL/min (ref >90)
GFR calc non Af Amer: >90 mL/min (ref >90)
Glucose, Bld: 104 mg/dL — ABNORMAL HIGH (ref 70–99)

## 2012-11-06 MED ORDER — IOHEXOL 300 MG/ML  SOLN
100.0000 mL | Freq: Once | INTRAMUSCULAR | Status: AC | PRN
  Administered 2012-11-06: 100 mL via INTRAVENOUS

## 2012-11-06 MED ORDER — SODIUM CHLORIDE 0.9 % IV SOLN: INTRAVENOUS | Status: DC

## 2012-11-06 MED ORDER — HYDROCODONE-ACETAMINOPHEN 7.5-650 MG PO TABS: 1.0000 | ORAL_TABLET | Freq: Four times a day (QID) | ORAL | Status: DC | PRN

## 2012-11-06 NOTE — ED Provider Notes (Signed)
CSN: [id number]     Arrival date & time 11/06/12  1125 History  This chart was scribed for Toy Baker, MD by Karle Plumber, ED Scribe. This patient was seen in room APA14/APA14 and the patient's care was started at 11:25 AM.    Chief Complaint  Patient presents with  . Shortness of Breath   The history is provided by the patient. No language interpreter was used.   HPI Comments:  Justin Duke is a 57 y.o. male who presents to the Emergency Department complaining of constant shortness of breath and difficult swallowing over the past 2 months. He also complains of a cough productive of phlegm and blood over the past 2 months. He has a left-sided neck nodule which has been present for 2 months, and was recently told by his disability doctor to have the area evaluated under the suspicion that it may be cancerous. Pt reports associated night sweats and weight loss over the past 2 months. He reports smoking 1.5 PPD for the past 42 years.  Pt reports family h/o of lymph node cancer with mother and grandmother dying from it.   Past Medical History  Diagnosis Date  . Hypertension   . Coronary artery disease    Past Surgical History  Procedure Laterality Date  . Leg surgery    . Hernia repair     History reviewed. No pertinent family history. History  Substance Use Topics  . Smoking status: Current Every Day Smoker -- 2.00 packs/day    Types: Cigarettes  . Smokeless tobacco: Not on file  . Alcohol Use: Yes     Comment: pt reports 2 40oz drinks today "heavy"    Review of Systems  Constitutional: Positive for diaphoresis (night sweats) and appetite change.  HENT: Positive for trouble swallowing.   Respiratory: Positive for cough and shortness of breath.   All other systems reviewed and are negative.   Allergies  Bee venom and Procaine hcl  Home Medications   Current Outpatient Rx  Name  Route  Sig  Dispense  Refill  . aspirin 81 MG tablet   Oral   Take 81 mg by mouth  daily.           . nitroGLYCERIN (NITROSTAT) 0.4 MG SL tablet   Sublingual   Place 0.4 mg under the tongue every 5 (five) minutes as needed.           Marland Kitchen UNKNOWN TO PATIENT   Oral   Take 1 tablet by mouth daily. BLOOD PRESSURE MEDICATION: NAME UNKNOWN          Triage Vitals: BP 122/86  Pulse 85  Temp(Src) 97.4 F (36.3 C) (Oral)  Resp 16  Ht 5\' 10"  (1.778 m)  Wt 96 lb (43.545 kg)  BMI 13.77 kg/m2  SpO2 100%  Physical Exam  Nursing note and vitals reviewed. Constitutional: He is oriented to person, place, and time. He appears well-developed.  emaciated  HENT:  Head: Normocephalic.  Proximal anterior cervical chain has a 2 cm hard lymphnode with no erythema, tender to palpation.  Eyes: Conjunctivae are normal. Pupils are equal, round, and reactive to light.  Neck:  No stridor appreciated.  Cardiovascular: Normal rate and regular rhythm.   Pulmonary/Chest: Effort normal and breath sounds normal. No stridor. No respiratory distress. He has no wheezes. He has no rales. He exhibits no tenderness.     Abdominal: He exhibits no distension.  Neurological: He is alert and oriented to person, place, and time.  Skin: Skin is warm and dry. No erythema.  Psychiatric: He has a normal mood and affect. His behavior is normal.   ED Course  Procedures (including critical care time)  DIAGNOSTIC STUDIES: Oxygen Saturation is 100% on RA, normal by my interpretation.   COORDINATION OF CARE: 12:45 PM-Patient advised of plan for treatment to obtain labs and chest X-Ray and patient agrees.  Medications  0.9 %  sodium chloride infusion (not administered)  iohexol (OMNIPAQUE) 300 MG/ML solution 100 mL (100 mLs Intravenous Contrast Given 11/06/12 1355)   Labs Review Labs Reviewed  CBC WITH DIFFERENTIAL - Abnormal; Notable for the following:    RBC 3.88 (*)    Hemoglobin 11.8 (*)    HCT 35.4 (*)    RDW 16.2 (*)    Platelets 401 (*)    Basophils Relative 2 (*)    All other  components within normal limits  COMPREHENSIVE METABOLIC PANEL - Abnormal; Notable for the following:    Sodium 131 (*)    Chloride 93 (*)    Glucose, Bld 104 (*)    BUN 3 (*)    Total Protein 8.5 (*)    Albumin 2.9 (*)    All other components within normal limits   Imaging Review Dg Chest 2 View  11/06/2012   *RADIOLOGY REPORT*  Clinical Data: Shortness of breath.  CHEST - 2 VIEW  Comparison: 11/13/2010.  Findings: The cardiac silhouette, mediastinal and hilar contours are normal.  There is a right upper lobe pulmonary nodule measuring 2.5 cm with possible slight central cavitation.  There is also a left lower lobe process which could be tumor, infiltrate and/or atelectasis with a small pleural effusion.  Recommend chest CT with contrast for further evaluation.  The bony thorax is intact.  No definite destructive bony changes.  IMPRESSION:  Bilateral pulmonary lesions.  Recommend chest CT with contrast for further evaluation.   Original Report Authenticated By: Rudie Meyer, M.D.    MDM  No diagnosis found.   Patient offered inpatient evaluation for his cancer of the larynx and has deferred. Risks explained to patient and a starting excess this. I have spoken with the [facility] staff and arrange an appointment for 1:30 PM on Wednesday of next week. Patient states he will comply with this and return precautions given. His airway is intact at the time of discharge. He has no stridor noted. He has been at his baseline currently for her over 2 months  Toy Baker, MD 11/06/12 1600

## 2012-11-06 NOTE — ED Notes (Addendum)
Pt has enlarged area to lt side of neck, sob. Hemoptysis . Hoarse  Pt is frail, emaciated. Hurts to swallow.

## 2012-11-06 NOTE — Telephone Encounter (Signed)
S/W DR. Freida Busman TO SCHEDULE NP APPT FOR 09/03 @ 1:30 W/DR. CHISM. WELCOME PACKET MAILED.

## 2012-11-11 ENCOUNTER — Ambulatory Visit: Payer: Medicaid Other

## 2012-11-11 ENCOUNTER — Encounter: Payer: Self-pay | Admitting: Internal Medicine

## 2012-11-11 ENCOUNTER — Telehealth: Payer: Self-pay | Admitting: Internal Medicine

## 2012-11-11 ENCOUNTER — Ambulatory Visit (HOSPITAL_BASED_OUTPATIENT_CLINIC_OR_DEPARTMENT_OTHER): Payer: Medicaid Other | Admitting: Lab

## 2012-11-11 ENCOUNTER — Ambulatory Visit (HOSPITAL_BASED_OUTPATIENT_CLINIC_OR_DEPARTMENT_OTHER): Payer: Medicaid Other | Admitting: Internal Medicine

## 2012-11-11 VITALS — BP 104/68 | HR 110 | Temp 98.0°F | Resp 21 | Ht 70.0 in | Wt 101.0 lb

## 2012-11-11 DIAGNOSIS — Z720 Tobacco use: Secondary | ICD-10-CM

## 2012-11-11 DIAGNOSIS — F1020 Alcohol dependence, uncomplicated: Secondary | ICD-10-CM

## 2012-11-11 DIAGNOSIS — J438 Other emphysema: Secondary | ICD-10-CM

## 2012-11-11 DIAGNOSIS — J439 Emphysema, unspecified: Secondary | ICD-10-CM | POA: Insufficient documentation

## 2012-11-11 DIAGNOSIS — D649 Anemia, unspecified: Secondary | ICD-10-CM

## 2012-11-11 DIAGNOSIS — R634 Abnormal weight loss: Secondary | ICD-10-CM

## 2012-11-11 DIAGNOSIS — R221 Localized swelling, mass and lump, neck: Secondary | ICD-10-CM

## 2012-11-11 DIAGNOSIS — R0609 Other forms of dyspnea: Secondary | ICD-10-CM

## 2012-11-11 DIAGNOSIS — R220 Localized swelling, mass and lump, head: Secondary | ICD-10-CM

## 2012-11-11 DIAGNOSIS — R1310 Dysphagia, unspecified: Secondary | ICD-10-CM

## 2012-11-11 DIAGNOSIS — F17200 Nicotine dependence, unspecified, uncomplicated: Secondary | ICD-10-CM

## 2012-11-11 DIAGNOSIS — E871 Hypo-osmolality and hyponatremia: Secondary | ICD-10-CM

## 2012-11-11 LAB — CBC WITH DIFFERENTIAL/PLATELET
Basophils Absolute: 0.1 10*3/uL (ref 0.0–0.1)
EOS%: 2.6 % (ref 0.0–7.0)
Eosinophils Absolute: 0.2 10*3/uL (ref 0.0–0.5)
HCT: 35.1 % — ABNORMAL LOW (ref 38.4–49.9)
HGB: 11.7 g/dL — ABNORMAL LOW (ref 13.0–17.1)
MCH: 30.3 pg (ref 27.2–33.4)
MONO#: 0.6 10*3/uL (ref 0.1–0.9)
NEUT#: 6.2 10*3/uL (ref 1.5–6.5)
NEUT%: 70.5 % (ref 39.0–75.0)
lymph#: 1.6 10*3/uL (ref 0.9–3.3)

## 2012-11-11 LAB — COMPREHENSIVE METABOLIC PANEL (CC13)
BUN: 4 mg/dL — ABNORMAL LOW (ref 7.0–26.0)
CO2: 26 mEq/L (ref 22–29)
Calcium: 9.5 mg/dL (ref 8.4–10.4)
Chloride: 98 mEq/L (ref 98–109)
Creatinine: 0.8 mg/dL (ref 0.7–1.3)
Glucose: 96 mg/dl (ref 70–140)

## 2012-11-11 MED ORDER — HYDROCODONE-ACETAMINOPHEN 7.5-650 MG PO TABS: 1.0000 | ORAL_TABLET | Freq: Four times a day (QID) | ORAL | Status: DC | PRN

## 2012-11-11 NOTE — Telephone Encounter (Signed)
Sent pt to labs today, pt will see Dr. Jenne Pane ENT on 9/9 , Radiation will see pt tomorrow, called [facility] Canes for smoking cessation class

## 2012-11-11 NOTE — Progress Notes (Signed)
Patient History and Physical   No referring provider defined for this encounter.  Justin Duke [id number] 1955-05-17 57 y.o. 11/11/2012  Medical Source: Patient and daughter Clydie Braun who can be reached at [phone]) and medical records  Chief Complaint: Dysphagia, dyspnea and weight lost x 2-3 months  HPI:  Patient is a 57 year-old male  with a history of alcohol abuse, longstanding tobacco abuse who is being evaluated for dyspnea, dysphagia and weight lost over past few months. He has been lost to medical follow-up for the last 1.5 years.  He was last seen in the emergency room by Dr. Ethelene Browns T. Freida Busman at [facility] on 11/06/2012.    He presented with constant dyspnea, dysphagia to solids and liquids with occasional odynophagia.  He also endorsed a productive chronic cough over the past six months to one year along with a lost of nearly 35 lbs.  His cough at times have streaks of blood.  In addition, he noticed increased size in a left neck nodule over one year which was evaluated by a disability doctor who recommended further evaluation with suspicion for malignancy.  He reports night sweats, drenching at time.  He has smoked 1.5 PPD over the past 42 years.  He also reports drinking "as much as possible" which could be over a 12 pack of 12 ounce beer daily.  He attributes his drinking to stressors, lost of his spouse in the mid nineties. He has tried to quit smoking and drinking unsuccessfully on multiple attempts.  He lives in [place] with his nephew.  He endorses occasional dizziness without falls.  Four years ago he underwent a cardiac workup which revealed CAD via a stress test.  He has been on unemployed for the past several years but he worked as a Education administrator previously.  Now his dyspnea on exertion, fatigue makes him unable to handle some of his intermediate ADLs without difficulty. He continues to smoke and drink daily.   PMH: Past Medical History  Diagnosis Date  . Hypertension    . Coronary artery disease     Past Surgical History  Procedure Laterality Date  . Leg surgery    . Hernia repair      Allergies: Allergies  Allergen Reactions  . Bee Venom Swelling  . Procaine Hcl Nausea And Vomiting    Medications: Current outpatient prescriptions:Aspirin-Salicylamide-Caffeine (BC HEADACHE) 325-95-16 MG TABS, Take 1 packet by mouth 2 (two) times daily as needed (for pain)., Disp: , Rfl: ;  hydrocodone-acetaminophen (LORCET PLUS) 7.5-650 MG per tablet, Take 1 tablet by mouth every 6 (six) hours as needed for pain., Disp: 60 tablet, Rfl: 0   Social History:   reports that he has been smoking Cigarettes.  He has been smoking about 2.00 packs per day. He does not have any smokeless tobacco history on file. He reports that  drinks alcohol. He reports that he does not use illicit drugs.  Lives in [place], [place] with his nephew.   Family History: No family history on file.  Review of Systems: Constitutional ROS: He denies Fever, Chills, but reports Night Sweats, +Anorexia, Pain to neck relieved with pain meds Cardiovascular ROS: positive for - dyspnea on exertion and shortness of breath Respiratory ROS: positive for - cough, shortness of breath and sputum changes Neurological ROS: negative for - confusion Dermatological ROS: positive for lumps and skin lesion changes ENT ROS: positive for - oral lesions Gastrointestinal ROS: positive for - abdominal pain and swallowing difficulty/pain Genito-Urinary ROS: negative for -  dysuria or hematuria Hematological and Lymphatic ROS: negative for - bleeding problems or blood transfusions positive for swollen lymph nodes Musculoskeletal ROS: negative for - gait disturbance or joint pain Remaining ROS negative.  Physical Exam: Blood pressure 104/68, pulse 110, temperature 98 F (36.7 C), temperature source Oral, resp. rate 21, height 5\' 10"  (1.778 m), weight 101 lb (45.813 kg), SpO2 98.00%. ECOG: 2 General appearance: alert,  cooperative, appears older than stated age, cachectic and mild distress with frequent coughing Head: Normocephalic, without obvious abnormality, atraumatic Neck: marked anterior cervical adenopathy, no JVD, supple, symmetrical, trachea midline and thyroid not enlarged, symmetric, no tenderness/mass/nodules Lymph nodes: Cervical adenopathy: bilaterally and Supraclavicular adenopathy: on Left about 1 cm HEENT: PERRL: EOMi.  OP without dentition; Whitish strips in buccal folds bilaterally.  No masses on buccal surface CVS: RR: S1S2 present; No m/r/g Lung: Decreased breath sounds (L>R) with prolonged expiratory phase Abdomen: S/+BS/mild TTP (deep) Ext: No C/C/C Neuro: AAOx3; can get off the table without difficulty.  Nonfocal with stable gait.    Lab Results: Lab Results  Component Value Date   WBC 8.8 11/11/2012   HGB 11.7* 11/11/2012   HCT 35.1* 11/11/2012   MCV 91.3 11/11/2012   PLT 422* 11/11/2012     Chemistry      Component Value Date/Time   NA 135* 11/11/2012 1545   NA 131* 11/06/2012 1244   K 4.1 11/11/2012 1545   K 4.0 11/06/2012 1244   CL 93* 11/06/2012 1244   CO2 26 11/11/2012 1545   CO2 30 11/06/2012 1244   BUN 4.0* 11/11/2012 1545   BUN 3* 11/06/2012 1244   CREATININE 0.8 11/11/2012 1545   CREATININE 0.54 11/06/2012 1244      Component Value Date/Time   CALCIUM 9.5 11/11/2012 1545   CALCIUM 9.6 11/06/2012 1244   ALKPHOS 83 11/11/2012 1545   ALKPHOS 77 11/06/2012 1244   AST 14 11/11/2012 1545   AST 18 11/06/2012 1244   ALT 7 11/11/2012 1545   ALT 8 11/06/2012 1244   BILITOT 0.30 11/11/2012 1545   BILITOT 0.4 11/06/2012 1244      Radiological Studies: CT of Chest/Neck with contrast (11/06/2012)  IMPRESSION:  CT neck:  1. Left glottic mass with supraglottic extension and bilateral heterogeneously enhancing adenopathy in the neck. 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 Oncology and/or otolaryngology follow-up recommended  for biopsy; staging via PET-CT may be helpful.   CT chest:  1. Large but ill-defined left lower lobe infrahilar mass completely obstructs the left lower lobe bronchus and causes a drowned lung appearance, and causes extrinsic mass effect on the left upper lobe bronchus. There is complex left pleural effusion suspicious for malignant effusion, as well as IA 2.8 cm right upper lobe cavitary nodule which is probably metastatic. Cavitary appearance is further suggestive of squamous cell carcinoma, likely metastatic from the  larynx although a 2nd primary is not excluded.  2. Inflammatory appearing nodularity in the left upper lobe.  3. Emphysema  4. Elevated left hemidiaphragm.  5. Pathologic gastrohepatic ligament adenopathy in the upper abdomen abdomen.  CT of Abdomen with contrast (11/2010) IMPRESSION:  Kidneys are within normal limits.  Bladder is distended with bladder wall thickening. Correlate with  urinalysis as for the need for cystoscopy.  L5-S1 spondylolisthesis.  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 + Risk factors include combined  tobacco/alcohol use.  -- Plan to obtain tissue diagnosis via biopsy by ENT or EUA with endoscopy,or  fiberoptic examinationpan scope.  Referral made to ENT.  -- Obtain a PET/CT given stage IV disease -- Refer to radiation oncology based on these results for consideration of definitive RT +/- concurrent systemic therapy or consideration of clinical trial (less favored in the setting of ongoing alcohol, tobacco abuse).  --Obtained baseline labs which demonstrate creatinine, wnl.  2. Lung mass secondary to # 1 versus primary with complex L pleural effusion.  3.Pathologic gastrohepatic ligament adenopathy -- Likely secondary to #1 4. Tobacco abuse. --Referral made to smoking cessation.  We advised him that actively smoking with potential Head and Neck cancer would lead  to poorer outcomes than non-smokers.  5. Alcohol dependency. --We stressed the importance of compliance with follow-up and that continued alcohol use will complicate compliance.   6. Emphysema 7. Anemia. --Likely secondary to advance malignancy.  Monitor CBC for now.  8. Poor medical follow-up and malnutrion. -- Referral Nutrition, speech and swallowing evaluation/therapy on next visit 9. History of hematuria and diffuse bladder wall thickening.  --Follow-up with UA, cytology once w/u for #1 is complete.  10. Hyponatremia --Likely secondary to #1.   We will monitor labs.  11. Pain in neck area with occasional odynophagia -- Refilled number 45 of hydrocodone/acetapmephen.  Counseled on constipation side effect.   Leeum Sankey, MD 11/11/2012, 6:53 PM

## 2012-11-11 NOTE — Progress Notes (Signed)
Checked in new pt with no ins.  Pt said that he was approved for Medicaid but had not received his card yet.  I informed pt that he will be self pay and when he receives his card to please contact us so we can enter it in the system and have billing submit to Medicaid for payment.

## 2012-11-12 ENCOUNTER — Encounter: Payer: Self-pay | Admitting: Internal Medicine

## 2012-11-12 ENCOUNTER — Ambulatory Visit
Admission: RE | Admit: 2012-11-12 | Discharge: 2012-11-12 | Disposition: A | Discharge status: Home / Self Care | Payer: Medicaid Other | Source: Ambulatory Visit | Attending: Radiation Oncology | Admitting: Radiation Oncology

## 2012-11-12 ENCOUNTER — Other Ambulatory Visit: Payer: Self-pay | Admitting: Medical Oncology

## 2012-11-12 ENCOUNTER — Encounter: Payer: Self-pay | Admitting: *Deleted

## 2012-11-12 ENCOUNTER — Encounter: Payer: Self-pay | Admitting: Radiation Oncology

## 2012-11-12 VITALS — BP 118/82 | HR 102 | Temp 97.8°F | Resp 20 | Ht 69.0 in | Wt 101.1 lb

## 2012-11-12 DIAGNOSIS — R0609 Other forms of dyspnea: Secondary | ICD-10-CM | POA: Insufficient documentation

## 2012-11-12 DIAGNOSIS — I2510 Atherosclerotic heart disease of native coronary artery without angina pectoris: Secondary | ICD-10-CM | POA: Insufficient documentation

## 2012-11-12 DIAGNOSIS — F17200 Nicotine dependence, unspecified, uncomplicated: Secondary | ICD-10-CM | POA: Insufficient documentation

## 2012-11-12 DIAGNOSIS — R220 Localized swelling, mass and lump, head: Secondary | ICD-10-CM | POA: Insufficient documentation

## 2012-11-12 DIAGNOSIS — R222 Localized swelling, mass and lump, trunk: Secondary | ICD-10-CM | POA: Insufficient documentation

## 2012-11-12 DIAGNOSIS — R042 Hemoptysis: Secondary | ICD-10-CM | POA: Insufficient documentation

## 2012-11-12 DIAGNOSIS — R5381 Other malaise: Secondary | ICD-10-CM | POA: Insufficient documentation

## 2012-11-12 DIAGNOSIS — R05 Cough: Secondary | ICD-10-CM | POA: Insufficient documentation

## 2012-11-12 DIAGNOSIS — R059 Cough, unspecified: Secondary | ICD-10-CM | POA: Insufficient documentation

## 2012-11-12 DIAGNOSIS — R0989 Other specified symptoms and signs involving the circulatory and respiratory systems: Secondary | ICD-10-CM | POA: Insufficient documentation

## 2012-11-12 DIAGNOSIS — R221 Localized swelling, mass and lump, neck: Secondary | ICD-10-CM

## 2012-11-12 DIAGNOSIS — I10 Essential (primary) hypertension: Secondary | ICD-10-CM | POA: Insufficient documentation

## 2012-11-12 DIAGNOSIS — R1310 Dysphagia, unspecified: Secondary | ICD-10-CM | POA: Insufficient documentation

## 2012-11-12 DIAGNOSIS — J90 Pleural effusion, not elsewhere classified: Secondary | ICD-10-CM | POA: Insufficient documentation

## 2012-11-12 HISTORY — DX: Allergy, unspecified, initial encounter: T78.40XA

## 2012-11-12 LAB — TSH CHCC: TSH: 3.678 m(IU)/L (ref 0.320–4.118)

## 2012-11-12 MED ORDER — HYDROCODONE-ACETAMINOPHEN 7.5-325 MG PO TABS: 1.0000 | ORAL_TABLET | Freq: Four times a day (QID) | ORAL | Status: DC | PRN

## 2012-11-12 MED ORDER — HYDROCODONE-ACETAMINOPHEN 7.5-325 MG/15ML PO SOLN: 15.0000 mL | Freq: Four times a day (QID) | ORAL | Status: DC | PRN

## 2012-11-12 NOTE — Progress Notes (Signed)
Yesterday, Justin Duke and I in the presence of his daughter Clydie Braun discussed code status.  Mr. Economou wanted to be DNR/DNI.   He voiced not wanting to be on a respiratory or have any life support like his mother who died of lung cancer.  In addition, we also discussed potentially admitting him to hospital if his respiratory status declines and/or compliance to medical therapy.  He understood that he would have to follow-up for appointments as requested and would inform us of any new symptoms.  His vitals were stable with the exception of mild tachycardia.  He declined admission today because he had some matters at home that needed his attention.

## 2012-11-12 NOTE — Progress Notes (Signed)
Head and Neck Cancer Location of Tumor / Histology: left neck mass   Patient presented  months ago with symptoms [id number] breath,dificulty swallowing past 6 months, along with night sweats, productive cough of phelgm and blood;  presented to ED at [facility]  11/06/12,increased left nodule over 1 year,  Biopsies of  (if applicable) revealed: none as yet, tentatively scheduled for  11/17/12 with Dr.Bates, ENT, daughter trying to get  Another MD  Trying to get medicaid   Nutrition Status:  Weight changes: 35 lb wt.loss x2-3 months  Swallowing status difficulty  With swallowing some foods  Plans, if any, for PEG tube no  Tobacco/Marijuana/Snuff/ETOH use: 2ppd >42 years, cigaretttes,  ETOH abuse,drinks   12 pack beer daily or more Past/Anticipated interventions by otolaryngology, if any , scheduled 9/10/ 14 Dr/Bates,  Past/Anticipated interventions by medical oncology, if any: appt scheduled 11/18/12 Dr.Chism Referrals yet, to any of the following?  Social Work? no  Dentistry? no  Swallowing therapy? no  Nutrition? no  Med/Onc? yes PEG placement?  SAFETY ISSUES:  Prior radiation? no  Pacemaker/ICD? no   Is the patient on methotrexate? no  Current Complaints / other details:  Dysphasia,dypsnea, single, unemployed Education administrator,  Hx HTN,CAD, leg surgery,hernia repair,lives with [employer]

## 2012-11-12 NOTE — Progress Notes (Signed)
Please see the Nurse Progress Note in the MD Initial Consult Encounter for this patient. 

## 2012-11-12 NOTE — Progress Notes (Signed)
Radiation Oncology         [phone]) [phone] ________________________________  Initial outpatient Consultation - Date: 11/12/2012   Name: Justin Duke MRN: [id number]   DOB: 01/23/1956  REFERRING PHYSICIAN: Myra Rude, MD  DIAGNOSIS:  1. Mass in neck     HISTORY OF PRESENT ILLNESS::Justin Duke is a 57 y.o. male  with a history of alcohol and tobacco abuse who presented to any [facility] emergency room on August 29 with dyspnea dysphasia odynophagia chronic cough and hemoptysis. He also reported a weight loss of 35 pounds. He also complained of an increasing left neck node over the past year. He also reports night sweats and dizziness. He reports increasing dyspnea on exertion and fatigue. While in the emergency room [facility] he underwent a chest x-ray which showed bilateral pulmonary lesions. A chest CT was recommended. He had a CT of the chest and neck performed on 11/06/2012. This showed multiple hilar and mediastinal lymph nodes as well as a gastrohepatic lymph node measuring 1.4 cm. An elevated left hemidiaphragm is noted with volume loss and age around the left lower lobe. A central left lower lobe mass was noted measuring approximately 4 x 2.4 cm. A small left pleural effusion was also noted. A 2.8 x 2.0 cm right upper lobe nodule was also noted. A left hilar mass was compressing the left upper lobe bronchus although the left upper lobe remained aerated. The CT of the neck to a left glottic mass was noted with supraglottic extension and bilateral enhancing lymph nodes. A left supraclavicular lymph node measured 0.7 cm. The left neck node measured 4.3 x 3.5 cm. The mass in the larynx was noted to possibly eroded the thyroid cartilage and extended into the (laryngeal space the left AE fold and into the epiglottis. He reports streaky hemoptysis with one episode of frank hemoptysis. He reports taking pain medications about every 4 hours. He reports his pain is worse with swallowing. He reports no  headaches or year pain. He has been evaluated for disability and it was actually during this disability evaluation that this left neck node was noted and prompted his visit to the emergency room. He has several family members with cancer. He would not want to be hospitalized at [facility]. He does not have a primary care physician. He does have a history of coronary artery disease which apparently he has undergone a stress test in the past which has shown coronary artery disease. He continues to smoke and drink daily.  PREVIOUS RADIATION THERAPY: No  PAST MEDICAL HISTORY:  has a past medical history of Hypertension; Coronary artery disease; and Allergy.    PAST SURGICAL HISTORY: Past Surgical History  Procedure Laterality Date  . Leg surgery    . Hernia repair      FAMILY HISTORY:  Family History  Problem Relation Age of Onset  . Cancer Mother     SOCIAL HISTORY:  History  Substance Use Topics  . Smoking status: Current Every Day Smoker -- 2.00 packs/day for 42 years    Types: Cigarettes  . Smokeless tobacco: Never Used  . Alcohol Use: Yes     Comment: pt reports 2 40oz drinks today "heavy"    ALLERGIES: Bee venom and Procaine hcl  MEDICATIONS:  Current Outpatient Prescriptions  Medication Sig Dispense Refill  . Aspirin-Salicylamide-Caffeine (BC HEADACHE) 325-95-16 MG TABS Take 1 packet by mouth 2 (two) times daily as needed (for pain).      Marland Kitchen HYDROcodone-acetaminophen (NORCO) 7.5-325 MG  per tablet Take 1 tablet by mouth every 6 (six) hours as needed for pain.  75 tablet  0   No current facility-administered medications for this encounter.    REVIEW OF SYSTEMS:  A 15 point review of systems is documented in the electronic medical record. This was obtained by the nursing staff. However, I reviewed this with the patient to discuss relevant findings and make appropriate changes.  Pertinent items are noted in HPI.  PHYSICAL EXAM:  Filed Vitals:   11/12/12 0840  BP:  118/82  Pulse: 102  Temp: 97.8 F (36.6 C)  Resp: 20  .101 lb 1.6 oz (45.859 kg). He is a cachectic male in no distress sitting comfortably on the examining room chair. He has a notable left neck node which is fixed and palpable in the level II position of the left neck. There is a level III lymph node palpable on the right neck. I'm not able to palpate the left supraclavicular node. He speaks with a whisper. Indirect mirror examination shows a large supraglottic mass and involvement of the epiglottis. He has diminished breath sounds on the left. He is alert and oriented x3.  LABORATORY DATA:  Lab Results  Component Value Date   WBC 8.8 11/11/2012   HGB 11.7* 11/11/2012   HCT 35.1* 11/11/2012   MCV 91.3 11/11/2012   PLT 422* 11/11/2012   Lab Results  Component Value Date   NA 135* 11/11/2012   K 4.1 11/11/2012   CL 93* 11/06/2012   CO2 26 11/11/2012   Lab Results  Component Value Date   ALT 7 11/11/2012   AST 14 11/11/2012   ALKPHOS 83 11/11/2012   BILITOT 0.30 11/11/2012     RADIOGRAPHY: Dg Chest 2 View  11/06/2012   *RADIOLOGY REPORT*  Clinical Data: Shortness of breath.  CHEST - 2 VIEW  Comparison: 11/13/2010.  Findings: The cardiac silhouette, mediastinal and hilar contours are normal.  There is a right upper lobe pulmonary nodule measuring 2.5 cm with possible slight central cavitation.  There is also a left lower lobe process which could be tumor, infiltrate and/or atelectasis with a small pleural effusion.  Recommend chest CT with contrast for further evaluation.  The bony thorax is intact.  No definite destructive bony changes.  IMPRESSION:  Bilateral pulmonary lesions.  Recommend chest CT with contrast for further evaluation.   Original Report Authenticated By: Rudie Meyer, M.D.   Ct Soft Tissue Neck W Contrast  11/06/2012   CLINICAL DATA:  Swelling along left-sided neck. Hoarseness. Pain for several months. Hypertension.  EXAM: CT NECK WITH CONTRAST; CT CHEST WITH CONTRAST  TECHNIQUE:  Multidetector CT imaging of the neck was performed with intravenous contrast.; Multidetector CT imaging of the chest was performed following the standard protocol during bolus administration of intravenous contrast.  CONTRAST:  OMNIPAQUE IOHEXOL 300 MG/ML  SOLN  COMPARISON:  Cervical spine CT from 05/07/2007 ; chest radiograph from 11/06/2012  FINDINGS: CT NECK: Heterogeneously enhancing 2.9 x 1.3 cm left glottic mass with supraglottic extension in the parapharyngeal space, effacement the left piriform sinus, and crossing the anterior commissure. Possible partial erosion of the thyroid cartilage. This mass extends cephalad in the left paralaryngeal space and left aryepiglottic fold and into the epiglottis.  Bilateral heterogeneous and necrotic adenopathy noted. Largest is a node spanning stations 2A and 2B measuring 4.3 x 3.5 cm on image 47 of series 8, and corresponding to the marked palpable abnormality. There are some faint calcifications within this heterogeneously  enhancing mass. A right station 2A lymph node measures 1.3 cm in short axis, image 44 of series 8. A left station 2B lymph node measures 1.8 cm in short axis on image 61 of series 8, just at the level of the hyoid bone. A left supraclavicular node measures 0.7 cm in short axis on image 90 of series 8.  CT CHEST: Right hilar lymph node, 1.2 cm in short axis. AP window lymph node, 0.9 cm in short axis. Gastrohepatic ligament lymph node, 1.4 cm in short axis. Elevated left hemidiaphragm with combined volume loss and drowned lung appearance of the left lower lobe with suspected central left lower lobe mass measuring on the order of 4.0 x 2.4 cm completely obstructing the left lower lobe bronchus. Complex left pleural effusion is small.  2.8 x 2.0 cm right upper lobe pulmonary nodule is partially cavitary.  Patchy nodularity in interstitial accentuation in the left upper lobe is likely inflammatory or due to postobstructive pneumonitis. The left  hilar mass causes extrinsic compression on the left upper lobe bronchus.  Irregular and fragmentary right acromion.  Emphysema noted.  IMPRESSION: CT neck:  1. Left glottic mass with supraglottic extension and bilateral heterogeneously enhancing adenopathy in the neck. 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 Oncology and/or otolaryngology follow-up recommended for biopsy; staging via PET-CT may be helpful. CT chest:  1. Large but ill-defined left lower lobe infrahilar mass completely obstructs the left lower lobe bronchus and causes a drowned lung appearance, and causes extrinsic mass effect on the left upper lobe bronchus. There is complex left pleural effusion suspicious for malignant effusion, as well as IA 2.8 cm right upper lobe cavitary nodule which is probably metastatic. Cavitary appearance is further suggestive of squamous cell carcinoma, likely metastatic from the larynx although a 2nd primary is not excluded. 2. Inflammatory appearing nodularity in the left upper lobe. 3. Emphysema 4. Elevated left hemidiaphragm. 5. Pathologic gastrohepatic ligament adenopathy in the upper abdomen.   Electronically Signed   By: Herbie Baltimore   On: 11/06/2012 15:18   Ct Chest W Contrast  11/06/2012   CLINICAL DATA:  Swelling along left-sided neck. Hoarseness. Pain for several months. Hypertension.  EXAM: CT NECK WITH CONTRAST; CT CHEST WITH CONTRAST  TECHNIQUE: Multidetector CT imaging of the neck was performed with intravenous contrast.; Multidetector CT imaging of the chest was performed following the standard protocol during bolus administration of intravenous contrast.  CONTRAST:  OMNIPAQUE IOHEXOL 300 MG/ML  SOLN  COMPARISON:  Cervical spine CT from 05/07/2007 ; chest radiograph from 11/06/2012  FINDINGS: CT NECK: Heterogeneously enhancing 2.9 x 1.3 cm left glottic mass with supraglottic extension in the parapharyngeal space,  effacement the left piriform sinus, and crossing the anterior commissure. Possible partial erosion of the thyroid cartilage. This mass extends cephalad in the left paralaryngeal space and left aryepiglottic fold and into the epiglottis.  Bilateral heterogeneous and necrotic adenopathy noted. Largest is a node spanning stations 2A and 2B measuring 4.3 x 3.5 cm on image 47 of series 8, and corresponding to the marked palpable abnormality. There are some faint calcifications within this heterogeneously enhancing mass. A right station 2A lymph node measures 1.3 cm in short axis, image 44 of series 8. A left station 2B lymph node measures 1.8 cm in short axis on image 61 of series 8, just at the level of the hyoid bone. A left supraclavicular node measures 0.7 cm  in short axis on image 90 of series 8.  CT CHEST: Right hilar lymph node, 1.2 cm in short axis. AP window lymph node, 0.9 cm in short axis. Gastrohepatic ligament lymph node, 1.4 cm in short axis. Elevated left hemidiaphragm with combined volume loss and drowned lung appearance of the left lower lobe with suspected central left lower lobe mass measuring on the order of 4.0 x 2.4 cm completely obstructing the left lower lobe bronchus. Complex left pleural effusion is small.  2.8 x 2.0 cm right upper lobe pulmonary nodule is partially cavitary.  Patchy nodularity in interstitial accentuation in the left upper lobe is likely inflammatory or due to postobstructive pneumonitis. The left hilar mass causes extrinsic compression on the left upper lobe bronchus.  Irregular and fragmentary right acromion.  Emphysema noted.  IMPRESSION: CT neck:  1. Left glottic mass with supraglottic extension and bilateral heterogeneously enhancing adenopathy in the neck. 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 Oncology and/or otolaryngology follow-up recommended for biopsy; staging via PET-CT may be helpful. CT  chest:  1. Large but ill-defined left lower lobe infrahilar mass completely obstructs the left lower lobe bronchus and causes a drowned lung appearance, and causes extrinsic mass effect on the left upper lobe bronchus. There is complex left pleural effusion suspicious for malignant effusion, as well as IA 2.8 cm right upper lobe cavitary nodule which is probably metastatic. Cavitary appearance is further suggestive of squamous cell carcinoma, likely metastatic from the larynx although a 2nd primary is not excluded. 2. Inflammatory appearing nodularity in the left upper lobe. 3. Emphysema 4. Elevated left hemidiaphragm. 5. Pathologic gastrohepatic ligament adenopathy in the upper abdomen.   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 Metastatic squamous cell carcinoma of the larynx to the lungs, biopsy and further workup pending  PLAN: I spoke with Justin Duke and his daughter today. We discussed the need for biopsy. He currently states he is eating and drinking well. He would like a refill on his pain medications but other than that feels that he is not having significant pain or respiratory distress. His bleeding is obviously worrisome. He is scheduled to see Dr. Jenne Pane on September 9 for biopsy of this left neck node. His scan does not indicate any indication right now for airway protection. The locally advanced lung cancer as well as his overall performance status I believe indicate a palliative approach which I've described to him and his daughter. He has lost several family members including his mother and father to lung cancer and head and neck cancer. He does not remember their treatment being very effective or very tolerable.However, he is willing to go forward with biopsy. I can imagine that he would be helped by palliative radiation to the left lung  possibly to open up this drowned left lower lobe. We will get him set up for a PET/CT scan and he has requested referral to [facility] for consideration of palliative radiation to this left hilar mass resulting in left lower lobe compromise. At that time palliative radiation to the larynx could be considered. I will leave that up to Dr. Thersa Salt. He has met with social work to help with his Medicaid application which is in the works. I have written him a prescription  for Norco tablets at his request. We discussed the importance of good nutrition. He has met with our head and neck cancer navigator as well. Medical oncology here in [place] is waiting for the results of his PET scan to schedule followup. The patient and his daughter had not decided whether they wanted to receive chemotherapy at all, or receive chemotherapy here in [place] versus at [place]. He has discussed and approved DO NOT RESUSCITATE/DO NOT INTUBATE status with Dr. Baltazar Apo. I spent 40 minutes  face to face with the patient and more than 50% of that time was spent in counseling and/or coordination of care.   ------------------------------------------------  Lurline Hare, MD

## 2012-11-12 NOTE — Telephone Encounter (Signed)
Pharmacist called stating the prescription that pt brought was for Lortab 7.5/500. This is no longer available. I called back to let them know that pt saw Dr. Michell Heinrich this am and she wrote him a prescription for Norco 7.5/325 (#75).  They will delete the Lortab and fill the one today from Dr. Michell Heinrich.

## 2012-11-13 ENCOUNTER — Telehealth: Payer: Self-pay | Admitting: Dietician

## 2012-11-13 ENCOUNTER — Telehealth (HOSPITAL_COMMUNITY): Payer: Self-pay | Admitting: *Deleted

## 2012-11-13 NOTE — Telephone Encounter (Signed)
Called patient to give information about our smoking cessation program and upcoming classes. Patient's daughter Clydie Braun answered. She stated patient is difficult to understand and she handles all of his medical appointments. Gave her information about our smoking cessation program and told her the next class starts October 6th. She stated she does have access to the Internet. Told her she can go online and get all of the information. I told if the schedule works for him that she can register him online. Also, gave her the information for the [employer]. Gave the phone number and told her about what it offers. Gave her my phone number if patient or her have any additional questions. Clydie Braun verbalized understanding.

## 2012-11-13 NOTE — Telephone Encounter (Signed)
Message copied by Priscille Heidelberg on Fri Nov 13, 2012  3:55 PM ------      Message from: Tami Ribas      Created: Wed Nov 11, 2012  4:00 PM       Jahmarion Popoff,            Pt needs a smoking cessation            Thank you            Rose                   ------

## 2012-11-13 NOTE — Telephone Encounter (Signed)
Brief Outpatient Oncology Nutrition Note  Patient has been identified to be at risk on malnutrition screen.  Wt Readings from Last 10 Encounters:  11/12/12 101 lb 1.6 oz (45.859 kg)  11/11/12 101 lb (45.813 kg)  11/06/12 96 lb (43.545 kg)  11/13/10 134 lb (60.782 kg)  10/22/10 140 lb (63.504 kg)   Called patient due to low weight.  Lung/neck cancer currently under workup.  Spoke with patient's daughter who reported patient with variable appetite effecting intake.  At times patient with sore throat which makes eating painful.  Patient also drinks a 12 pack of beer daily.  Daughter has been making sure that patient has foods he can tolerate and visits twice a day to check on him.    Mailed daughter coupons for Ensure and [employer].  Mailed fact sheets to improve calories and protein on a soft diet and sore throat tips.  Encouraged daughter to call for any questions.  Contact information provided for [facility].  Oran Rein, RD, LDN

## 2012-11-14 NOTE — Progress Notes (Signed)
Initial meeting with patient during consult with Dr. Michell Heinrich.  Introduced myself as his navigator, explained my role.  Will navigate as New Patient.  Young Berry, RN, BSN, [facility] Head & Neck Oncology Navigator [phone]

## 2012-11-16 ENCOUNTER — Telehealth: Payer: Self-pay | Admitting: *Deleted

## 2012-11-16 NOTE — Telephone Encounter (Signed)
In response to call from pt's dtr, Clydie Braun, I facilitated scheduling of pt's appt with Dr. Jenne Pane at [facility] for tomorrow, 9/9, at 0940 by providing 1) Dr. Benjiman Core NPI #, 2) fax # for Dr. Rosie Fate, and 3) faxing of Dr. Benjiman Core notes from 9/3.

## 2012-11-17 ENCOUNTER — Telehealth: Payer: Self-pay

## 2012-11-17 ENCOUNTER — Other Ambulatory Visit (HOSPITAL_COMMUNITY)
Admission: RE | Admit: 2012-11-17 | Discharge: 2012-11-17 | Disposition: A | Discharge status: Home / Self Care | Payer: Medicaid Other | Source: Ambulatory Visit | Attending: Otolaryngology | Admitting: Otolaryngology

## 2012-11-17 ENCOUNTER — Other Ambulatory Visit: Payer: Self-pay | Admitting: Otolaryngology

## 2012-11-17 DIAGNOSIS — R599 Enlarged lymph nodes, unspecified: Secondary | ICD-10-CM | POA: Insufficient documentation
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
## 2012-11-17 NOTE — Telephone Encounter (Signed)
Called pt dtr to inform; she indicated understanding.  She also indicated understanding that appt will require out-of-pocket payment pending Medicaid approval.  Will continue to navigate as L1 patient (i.e. New Patient).  Young Berry, RN, BSN, [facility] Head & Neck Oncology Navigator [phone]

## 2012-11-18 ENCOUNTER — Ambulatory Visit (HOSPITAL_BASED_OUTPATIENT_CLINIC_OR_DEPARTMENT_OTHER): Payer: Medicaid Other | Admitting: Internal Medicine

## 2012-11-18 ENCOUNTER — Encounter: Payer: Self-pay | Admitting: Internal Medicine

## 2012-11-18 ENCOUNTER — Telehealth: Payer: Self-pay | Admitting: Medical Oncology

## 2012-11-18 ENCOUNTER — Other Ambulatory Visit: Payer: Self-pay | Admitting: Medical Oncology

## 2012-11-18 ENCOUNTER — Encounter: Payer: Self-pay | Admitting: *Deleted

## 2012-11-18 ENCOUNTER — Encounter (HOSPITAL_COMMUNITY): Admission: RE | Admit: 2012-11-18 | Payer: Self-pay | Source: Ambulatory Visit

## 2012-11-18 ENCOUNTER — Ambulatory Visit: Payer: Self-pay | Admitting: Nutrition

## 2012-11-18 VITALS — BP 131/87 | HR 94 | Temp 97.6°F | Resp 17 | Ht 69.0 in | Wt 96.7 lb

## 2012-11-18 DIAGNOSIS — C4442 Squamous cell carcinoma of skin of scalp and neck: Secondary | ICD-10-CM

## 2012-11-18 DIAGNOSIS — R634 Abnormal weight loss: Secondary | ICD-10-CM

## 2012-11-18 DIAGNOSIS — F17200 Nicotine dependence, unspecified, uncomplicated: Secondary | ICD-10-CM

## 2012-11-18 DIAGNOSIS — E46 Unspecified protein-calorie malnutrition: Secondary | ICD-10-CM

## 2012-11-18 DIAGNOSIS — J90 Pleural effusion, not elsewhere classified: Secondary | ICD-10-CM

## 2012-11-18 DIAGNOSIS — C329 Malignant neoplasm of larynx, unspecified: Secondary | ICD-10-CM

## 2012-11-18 DIAGNOSIS — R1310 Dysphagia, unspecified: Secondary | ICD-10-CM

## 2012-11-18 DIAGNOSIS — M542 Cervicalgia: Secondary | ICD-10-CM

## 2012-11-18 DIAGNOSIS — R221 Localized swelling, mass and lump, neck: Secondary | ICD-10-CM

## 2012-11-18 DIAGNOSIS — F1020 Alcohol dependence, uncomplicated: Secondary | ICD-10-CM

## 2012-11-18 DIAGNOSIS — R222 Localized swelling, mass and lump, trunk: Secondary | ICD-10-CM

## 2012-11-18 DIAGNOSIS — R599 Enlarged lymph nodes, unspecified: Secondary | ICD-10-CM

## 2012-11-18 DIAGNOSIS — F1010 Alcohol abuse, uncomplicated: Secondary | ICD-10-CM

## 2012-11-18 DIAGNOSIS — Z720 Tobacco use: Secondary | ICD-10-CM

## 2012-11-18 MED ORDER — HYDROCODONE-ACETAMINOPHEN 5-325 MG PO TABS
1.0000 | ORAL_TABLET | Freq: Once | ORAL | Status: AC
  Administered 2012-11-18: 1 via ORAL

## 2012-11-18 MED ORDER — HYDROCODONE-ACETAMINOPHEN 5-325 MG PO TABS
ORAL_TABLET | ORAL | Status: AC
  Filled 2012-11-18: qty 1

## 2012-11-18 MED ORDER — HYDROCODONE-ACETAMINOPHEN 7.5-325 MG PO TABS: 1.0000 | ORAL_TABLET | Freq: Four times a day (QID) | ORAL | Status: DC | PRN

## 2012-11-18 NOTE — Telephone Encounter (Signed)
I called [facility] to refer pt per Dr. Myra Rude. I faxed over office notes, med list and demographics. I was informed that they will delay admission if pt does the palliative radiation. They will contact the daughter Clydie Braun to further discuss.

## 2012-11-18 NOTE — Progress Notes (Signed)
400.00 grant for patient. I have letter of support from daughter and nephew.

## 2012-11-18 NOTE — Progress Notes (Signed)
Was contacted by RN to provide samples of oral nutrition supplements for patient.  I met briefly with patient in the exam room.  Patient was provided with one complementary case of Ensure Plus along with some samples of ensure pudding.  He was encouraged to consume throughout the day.  He was educated on how to thin it down if it is too rich.  Contact information provided for additional supplements as needed.

## 2012-11-18 NOTE — Patient Instructions (Signed)
1. Referral to hospice.  2. Hold PET CT now.  Follow up w radiation for pallative radiation.  3. Pain control.

## 2012-11-18 NOTE — Telephone Encounter (Signed)
Gave pt appt for MD visit, cancelled PET scan, pt being referred to [facility]

## 2012-11-20 DIAGNOSIS — C4442 Squamous cell carcinoma of skin of scalp and neck: Secondary | ICD-10-CM | POA: Insufficient documentation

## 2012-11-20 NOTE — Progress Notes (Signed)
Patient History and Physical   No referring provider defined for this encounter.  LOC FEINSTEIN [id number] 1955/09/16 57 y.o. 11/18/2012  11:00 AM  Chief Complaint: Follow-up, Squamous cell carcinoma, neck  HPI:  Patient is a 57 year-old male  with a history of alcohol abuse, longstanding tobacco abuse who esd evaluated for dyspnea, dysphagia and weight lost over past few months.  He had a biopsy of his neck mass and it is consistent with squamous cell carcinoma.  He was last seen by me on 11/11/2012.  He was also seen by radiation oncology.  Today, he is accompanied by his daughter Clydie Braun.  She reports that some stole his prescription for pain meds.  He endorses 10 of 10 pain.  He has been NPO for the last several hours for PET scan later than morning.   He continues to drink alcohol "as often as I can".    He denies recent hospitalizations.   PMH: Past Medical History  Diagnosis Date  . Hypertension   . Coronary artery disease   . Allergy     bee venom/procaine hcl    Past Surgical History  Procedure Laterality Date  . Leg surgery    . Hernia repair      Allergies: Allergies  Allergen Reactions  . Bee Venom Swelling  . Procaine Hcl Nausea And Vomiting    Medications: Current outpatient prescriptions:HYDROcodone-acetaminophen (NORCO) 7.5-325 MG per tablet, Take 1 tablet by mouth every 6 (six) hours as needed for pain., Disp: 75 tablet, Rfl: 0   Social History:   reports that he has been smoking Cigarettes.  He has a 84 pack-year smoking history. He has never used smokeless tobacco. He reports that  drinks alcohol. He reports that he does not use illicit drugs.  Lives in [place], [place] with his nephew.   Family History: Family History  Problem Relation Age of Onset  . Cancer Mother     Review of Systems: Constitutional ROS: He denies Fever, Chills, but reports Night Sweats, +Anorexia, Pain to neck relieved with pain meds Cardiovascular ROS: positive for - dyspnea on  exertion and shortness of breath Respiratory ROS: positive for - cough, shortness of breath and sputum changes Neurological ROS: negative for - confusion Dermatological ROS: positive for lumps and skin lesion changes ENT ROS: positive for - oral lesions Gastrointestinal ROS: positive for - abdominal pain and swallowing difficulty/pain Genito-Urinary ROS: negative for - dysuria or hematuria Hematological and Lymphatic ROS: negative for - bleeding problems or blood transfusions positive for swollen lymph nodes Musculoskeletal ROS: negative for - gait disturbance or joint pain Remaining ROS negative.  Physical Exam: Blood pressure 131/87, pulse 94, temperature 97.6 F (36.4 C), temperature source Oral, resp. rate 17, height 5\' 9"  (1.753 m), weight 96 lb 11.2 oz (43.863 kg), SpO2 100.00%. ECOG: 3 General appearance: alert, cooperative, appears older than stated age, cachectic and mild distress with frequent coughing Head: Normocephalic, without obvious abnormality, atraumatic Neck: marked anterior cervical adenopathy, no JVD, supple, symmetrical, trachea midline and thyroid not enlarged, symmetric, no tenderness/mass/nodules Lymph nodes: Cervical adenopathy: bilaterally and Supraclavicular adenopathy: on Left about 1 cm HEENT: PERRL: EOMi.  OP without dentition; Whitish strips in buccal folds bilaterally.  No masses on buccal surface CVS: RR: S1S2 present; No m/r/g Lung: Decreased breath sounds (L>R) with prolonged expiratory phase Abdomen: S/+BS/mild TTP (deep) Ext: No C/C/C Neuro: AAOx3; can get off the table without difficulty.  Nonfocal with stable gait.    Lab Results: Lab Results  Component  Value Date   WBC 8.8 11/11/2012   HGB 11.7* 11/11/2012   HCT 35.1* 11/11/2012   MCV 91.3 11/11/2012   PLT 422* 11/11/2012     Chemistry      Component Value Date/Time   NA 135* 11/11/2012 1545   NA 131* 11/06/2012 1244   K 4.1 11/11/2012 1545   K 4.0 11/06/2012 1244   CL 93* 11/06/2012 1244   CO2 26  11/11/2012 1545   CO2 30 11/06/2012 1244   BUN 4.0* 11/11/2012 1545   BUN 3* 11/06/2012 1244   CREATININE 0.8 11/11/2012 1545   CREATININE 0.54 11/06/2012 1244      Component Value Date/Time   CALCIUM 9.5 11/11/2012 1545   CALCIUM 9.6 11/06/2012 1244   ALKPHOS 83 11/11/2012 1545   ALKPHOS 77 11/06/2012 1244   AST 14 11/11/2012 1545   AST 18 11/06/2012 1244   ALT 7 11/11/2012 1545   ALT 8 11/06/2012 1244   BILITOT 0.30 11/11/2012 1545   BILITOT 0.4 11/06/2012 1244      Radiological Studies: CT of Chest/Neck with contrast (11/06/2012)  IMPRESSION:  CT neck:  1. Left glottic mass with supraglottic extension and bilateral heterogeneously enhancing adenopathy in the neck. 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 Oncology and/or otolaryngology follow-up recommended for biopsy; staging via PET-CT may be helpful.   CT chest:  1. Large but ill-defined left lower lobe infrahilar mass completely obstructs the left lower lobe bronchus and causes a drowned lung appearance, and causes extrinsic mass effect on the left upper lobe bronchus. There is complex left pleural effusion suspicious for malignant effusion, as well as IA 2.8 cm right upper lobe cavitary nodule which is probably metastatic. Cavitary appearance is further suggestive of squamous cell carcinoma, likely metastatic from the  larynx although a 2nd primary is not excluded.  2. Inflammatory appearing nodularity in the left upper lobe.  3. Emphysema  4. Elevated left hemidiaphragm.  5. Pathologic gastrohepatic ligament adenopathy in the upper abdomen abdomen.  CT of Abdomen with contrast (11/2010) IMPRESSION:  Kidneys are within normal limits.  Bladder is distended with bladder wall thickening. Correlate with  urinalysis as for the need for cystoscopy.  L5-S1 spondylolisthesis.  Pathology: THE CYTOLOGIC FEATURES ARE CONSISTENT WITH SQUAMOUS CELL CARCINOMA. THE CASE WAS  DISCUSSED WITH DR. BATES ON 11/19/2012. Pecola Leisure MD Pathologist, Electronic Signature (Case signed 11/19/2012) Specimen Clinical Information Left Cervical Node Mass Source Lymph Node, Fine Needle Aspiration, Left Cervical Node Gross Specimen: Received is/are 30cc's of clear cytolyt solution and 4 air dried slides. (MW:ph) Prepared: # Smears: 4 # Concentration Technique Slides (i.e. ThinPrep): 1 # Cell Block: Cell block attempted, but not obtained Additional Studies: N/A  Impression and Plan: 1.  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 + Risk factors include combined tobacco/alcohol use.  -- Raiford Noble, Head and Neck nurse navigator, and I had a discussion with patient regarding hospice, a team of health care practitioners who maximize the comfort of patients with an expected survival of less than 6 months.  Given his poor functional status and extent of his disease and profound weight loss and anorexia, we will make a referral to hospice.  He was agreeable with a referral. We reiterated hospice can be set up at home as the patient requested. 2. Lung mass secondary to # 1 versus primary with complex L pleural effusion.  3.Pathologic gastrohepatic ligament adenopathy --  Likely secondary to #1 4. Tobacco abuse. 5. Malnutrion.  Nutrition referral made.  6. History of hematuria and diffuse bladder wall thickening.  7. Pain in neck area with occasional odynophagia -- Prescribed 45 of hydrocodone/acetapmephen.  Counseled previously on constipation side effect. 8. Follow-up.  RTC in 1 month.    Korra Christine, MD 11/18/2012  11:00am

## 2012-11-24 NOTE — Progress Notes (Signed)
Met with pt and his dtr during scheduled appt with Dr. Rosie Fate.  Had opportunity to speak with pt in private re: expressed concerns about accepting dtr's offer to move in with her and her family; "don't want to be a burden".  Encouraged him to talk with her further, especially in context of Dr. Benjiman Core suggestion that he might want to consider hospice.  I shared with pt my experience and knowledge of hospice to assist with his initial consideration of this prospect.  Will continue to navigate as L1 (new) patient.  Young Berry, RN, BSN, [facility] Head & Neck Oncology Navigator [phone]

## 2012-11-30 ENCOUNTER — Encounter: Payer: Self-pay | Admitting: *Deleted

## 2012-11-30 NOTE — Progress Notes (Signed)
Called and spoke with pt's dtr, Clydie Braun.  She indicated her dad started his RTs today and will be completed in two weeks.  She stated he "seems to be doing fine" and that he has been coming to her  home to stay with her during the day.  I encouraged her to call me at any time and to relay the same to her dad.  Will continue to navigate as L1 (new) patient.  Young Berry, RN, BSN, [facility] Head & Neck Oncology Navigator [phone]

## 2012-12-07 ENCOUNTER — Telehealth: Payer: Self-pay | Admitting: *Deleted

## 2012-12-09 ENCOUNTER — Telehealth: Payer: Self-pay | Admitting: *Deleted

## 2012-12-09 NOTE — Telephone Encounter (Signed)
Error in documentation

## 2012-12-09 NOTE — Telephone Encounter (Signed)
Pt's daughter, Wilhemina Cash, called, expressing concern about her dad's condition over the weekend.  Per her conversation with hospice RN (he is not currently being seen by hospice but Clydie Braun has been communicating with them), pt had a "focal seizure" Saturday evening.  Dtr stated he also has been "falling into the wall" with some frequency as well.  She indicated that he has had no recollection of these events.  Dtr was told by hospice that these symptoms suggest brain metastasis and that pt "probably has just a few weeks" to live.  Pt is currently receiving RT by Dr. Thersa Salt and the last tmt is scheduled for this Friday.  Clydie Braun indicated she wants to ask Dr. Thersa Salt questions during visits with her dad but he doesn't want her to b/c he thinks it will "stress her out" even more.  I asked her if having answers would help her with the stress of this situation she responded "yes".  I encouraged her share this with her dad.   I asked Clydie Braun to give me a call later this week with an update.  She indicated she would do that.  Will continue to navigate as L2 (treatments established) patient.  Young Berry, RN, BSN, [facility] Head & Neck Oncology Navigator [phone]

## 2012-12-15 ENCOUNTER — Other Ambulatory Visit: Payer: Self-pay | Admitting: *Deleted

## 2012-12-15 ENCOUNTER — Telehealth: Payer: Self-pay | Admitting: *Deleted

## 2012-12-15 DIAGNOSIS — C4442 Squamous cell carcinoma of skin of scalp and neck: Secondary | ICD-10-CM

## 2012-12-15 MED ORDER — PSEUDOEPHEDRINE-GUAIFENESIN ER 60-600 MG PO TB12: 2.0000 | ORAL_TABLET | Freq: Two times a day (BID) | ORAL | Status: AC | PRN

## 2012-12-15 NOTE — Telephone Encounter (Signed)
Received call from [place], RN with [facility] informing re:  Pt was admitted to hospice yesterday 12/14/12.   Per Waynetta Sandy, pt requested medication to help with congestion;  Beth also would like for pt to have Albuterol to help with short of breath problem.    Message relayed to Dr. Rosie Fate for review. Beth stated meds can be faxed to [facility] -  Fax   [phone]. Beth's phone   [phone]   ;   [facility]  Phone    [phone]  Ext   [phone].

## 2012-12-15 NOTE — Telephone Encounter (Signed)
Spoke with hospice nurse Beth and informed her re:  Rxs for  Albuterol Nebulizer  2.5mg /54ml  -  Use 1 nebulizer  Four times daily as needed for short of breath  Has been called in to [facility] to be delivered to pt. Informed Beth also that Mucinex D has been prescribed for pt's congestion problem. Albuterol Nebulizer  25 vials  With 2 refills.

## 2012-12-16 ENCOUNTER — Telehealth: Payer: Self-pay | Admitting: *Deleted

## 2012-12-16 NOTE — Telephone Encounter (Signed)
Spoke with pt's daughter, Wilhemina Cash, who provided status update for her dad.   She indicated that Dr. Thersa Salt (RT) has completed tmts for lung mass and is considering tmts to neck "to help with his breathing".  She indicated that [facility] has enrolled him and that he is having his initial home visit this Thursday.  She stated that she was able to address her questions and concerns with Dr. Thersa Salt during a recent visit.  She indicated that she is "feeling better" about her dad's care now that Hospice is involved.  I encouraged her to access services [facility] provides for family/caregivers.  She indicated she is aware of what is offered.   Will continue to navigate as L2 (treatments established) patient.  Young Berry, RN, BSN, [facility] Head & Neck Oncology Navigator [phone]

## 2012-12-21 ENCOUNTER — Telehealth: Payer: Self-pay | Admitting: Internal Medicine

## 2012-12-21 ENCOUNTER — Ambulatory Visit: Payer: Self-pay

## 2012-12-21 NOTE — Telephone Encounter (Signed)
pt daughter called to r/s appt to earlier time...done

## 2012-12-22 ENCOUNTER — Telehealth: Payer: Self-pay | Admitting: *Deleted

## 2012-12-22 ENCOUNTER — Encounter: Payer: Self-pay | Admitting: Medical Oncology

## 2012-12-22 NOTE — Telephone Encounter (Signed)
Pt dtr called to provide update on her dad's condition.  Stated that several mornings over the past days he has been difficult to arouse and needs to be carried from bed to couch.  She expressed concern about the possibility that he will need to be carried to the car to come for tomorrow's appt with Dr. Rosie Fate.  She indicated that the hospice RN saw him yesterday and was not concerned about these episodes.  I indicated that I would contact Dr. Rosie Fate re: the necessity to keep the scheduled f/u appt.  I called dtr later to indicate that Dr. Rosie Fate stated it was not necessary to keep tomorrow's appt.  Dtr indicated understanding.  Will continue to navigate as L3 (treatments completed) patient.  Young Berry, RN, BSN, [facility] Head & Neck Oncology Navigator [phone]

## 2012-12-22 NOTE — Progress Notes (Signed)
Young Berry spoke with Dr. Rosie Fate regarding pt's appointment in the am. They do not feel pt needs to be seen. Appointment cancelled.

## 2012-12-23 ENCOUNTER — Ambulatory Visit: Payer: Self-pay

## 2012-12-28 ENCOUNTER — Other Ambulatory Visit: Payer: Self-pay | Admitting: Medical Oncology

## 2012-12-28 ENCOUNTER — Encounter: Payer: Self-pay | Admitting: Medical Oncology

## 2012-12-28 MED ORDER — OXYCODONE HCL 5 MG PO CAPS: 10.0000 mg | ORAL_CAPSULE | ORAL | Status: AC | PRN

## 2012-12-30 ENCOUNTER — Telehealth: Payer: Self-pay | Admitting: *Deleted

## 2012-12-30 NOTE — Telephone Encounter (Signed)
Called patient's daughter for update on his condition.  She indicated that he has "been doing better" over the past weeks, more alert, more ambulatory.  She thinks some of this change is attributable to medication adjustments Hospice has made.  He continues to live at his residence.  She stated that her dad and she would like to know if the RT has made any difference with the cancer.  I encouraged her to address that need/question with Dr. Thersa Salt.  Will continue to navigate as L3 (treatments completed) patient.  Young Berry, RN, BSN, [facility] Head & Neck Oncology Navigator [phone]

## 2013-01-14 ENCOUNTER — Other Ambulatory Visit: Payer: Self-pay

## 2013-01-18 ENCOUNTER — Telehealth: Payer: Self-pay | Admitting: *Deleted

## 2013-01-18 NOTE — Telephone Encounter (Signed)
Spoke with patient's dtr who provided an update on her dad's condition.  He is now being seen by home hospice 3x per week, continues to take medications by mouth, but is not eating or drinking much, lower extremity mottling becoming evident.  Remains alert.  Continues to live at his home; refusing admission to residential hospice.  Young Berry, RN, BSN, [facility] Head & Neck Oncology Navigator [phone]

## 2013-01-27 ENCOUNTER — Telehealth: Payer: Self-pay | Admitting: *Deleted

## 2013-01-28 NOTE — Telephone Encounter (Signed)
Rec'd call from patient's daughter, Clydie Braun, informing me of her dad's passing around 2:00 today.  I expressed the condolences of his [facility] Team.  Based on previous conversations I've had with Clydie Braun, I encouraged her, again, to avail herself to Hospice support services, for her as well as her children and other family members.  Young Berry, RN, BSN, [facility] Head & Neck Oncology Navigator [phone]

## 2013-02-08 DEATH — deceased
